# Patient Record
Sex: Female | Born: 1950
Health system: Southern US, Community
[De-identification: ages and names within clinical notes are randomized; demographics above are authoritative.]

---

## 2005-05-30 ENCOUNTER — Ambulatory Visit: Payer: Self-pay | Admitting: Urology

## 2005-10-22 ENCOUNTER — Emergency Department: Payer: Self-pay | Admitting: Emergency Medicine

## 2005-11-26 ENCOUNTER — Ambulatory Visit: Payer: Self-pay | Admitting: Internal Medicine

## 2006-10-05 DIAGNOSIS — D229 Melanocytic nevi, unspecified: Secondary | ICD-10-CM

## 2006-10-05 HISTORY — DX: Melanocytic nevi, unspecified: D22.9

## 2006-11-30 ENCOUNTER — Ambulatory Visit: Payer: Self-pay | Admitting: Internal Medicine

## 2007-12-06 ENCOUNTER — Ambulatory Visit: Payer: Self-pay | Admitting: Internal Medicine

## 2008-12-13 ENCOUNTER — Ambulatory Visit: Payer: Self-pay | Admitting: Internal Medicine

## 2009-06-26 ENCOUNTER — Ambulatory Visit: Payer: Self-pay | Admitting: Urology

## 2009-11-02 ENCOUNTER — Ambulatory Visit: Payer: Self-pay | Admitting: Gastroenterology

## 2010-01-09 ENCOUNTER — Ambulatory Visit: Payer: Self-pay | Admitting: Podiatry

## 2010-01-23 ENCOUNTER — Ambulatory Visit: Payer: Self-pay | Admitting: Internal Medicine

## 2010-09-01 HISTORY — PX: AUGMENTATION MAMMAPLASTY: SUR837

## 2011-01-28 ENCOUNTER — Ambulatory Visit: Payer: Self-pay | Admitting: Internal Medicine

## 2012-01-29 ENCOUNTER — Ambulatory Visit: Payer: Self-pay | Admitting: Internal Medicine

## 2013-04-25 ENCOUNTER — Ambulatory Visit: Payer: Self-pay | Admitting: Internal Medicine

## 2013-04-29 ENCOUNTER — Ambulatory Visit: Payer: Self-pay | Admitting: Internal Medicine

## 2013-11-01 ENCOUNTER — Ambulatory Visit: Payer: Self-pay | Admitting: Internal Medicine

## 2014-07-21 ENCOUNTER — Ambulatory Visit: Payer: Self-pay | Admitting: Internal Medicine

## 2014-07-26 ENCOUNTER — Ambulatory Visit: Payer: Self-pay | Admitting: Internal Medicine

## 2014-10-27 ENCOUNTER — Ambulatory Visit: Payer: Self-pay | Admitting: Internal Medicine

## 2014-12-15 ENCOUNTER — Other Ambulatory Visit: Payer: Self-pay | Admitting: Internal Medicine

## 2014-12-15 DIAGNOSIS — N6320 Unspecified lump in the left breast, unspecified quadrant: Secondary | ICD-10-CM

## 2015-01-26 ENCOUNTER — Inpatient Hospital Stay: Admission: RE | Admit: 2015-01-26 | Payer: Self-pay | Source: Ambulatory Visit

## 2015-02-02 ENCOUNTER — Ambulatory Visit
Admission: RE | Admit: 2015-02-02 | Discharge: 2015-02-02 | Disposition: A | Discharge status: Home / Self Care | Payer: BLUE CROSS/BLUE SHIELD | Source: Ambulatory Visit | Attending: Internal Medicine | Admitting: Internal Medicine

## 2015-02-02 ENCOUNTER — Other Ambulatory Visit: Payer: Self-pay | Admitting: Internal Medicine

## 2015-02-02 DIAGNOSIS — N6320 Unspecified lump in the left breast, unspecified quadrant: Secondary | ICD-10-CM

## 2015-02-02 DIAGNOSIS — N63 Unspecified lump in breast: Secondary | ICD-10-CM | POA: Insufficient documentation

## 2015-07-24 ENCOUNTER — Other Ambulatory Visit: Payer: Self-pay | Admitting: Internal Medicine

## 2015-07-24 DIAGNOSIS — N63 Unspecified lump in unspecified breast: Secondary | ICD-10-CM

## 2015-08-13 ENCOUNTER — Ambulatory Visit
Admission: RE | Admit: 2015-08-13 | Discharge: 2015-08-13 | Disposition: A | Discharge status: Home / Self Care | Payer: BLUE CROSS/BLUE SHIELD | Source: Ambulatory Visit | Attending: Internal Medicine | Admitting: Internal Medicine

## 2015-08-13 DIAGNOSIS — N63 Unspecified lump in unspecified breast: Secondary | ICD-10-CM

## 2015-08-13 DIAGNOSIS — Z9882 Breast implant status: Secondary | ICD-10-CM | POA: Diagnosis not present

## 2015-08-14 ENCOUNTER — Other Ambulatory Visit: Payer: Self-pay | Admitting: Internal Medicine

## 2015-08-14 DIAGNOSIS — N6320 Unspecified lump in the left breast, unspecified quadrant: Secondary | ICD-10-CM

## 2015-08-14 DIAGNOSIS — Z1239 Encounter for other screening for malignant neoplasm of breast: Secondary | ICD-10-CM

## 2015-12-11 ENCOUNTER — Other Ambulatory Visit: Payer: Self-pay | Admitting: Internal Medicine

## 2015-12-11 DIAGNOSIS — Z1239 Encounter for other screening for malignant neoplasm of breast: Secondary | ICD-10-CM

## 2015-12-11 DIAGNOSIS — N63 Unspecified lump in unspecified breast: Secondary | ICD-10-CM

## 2015-12-12 ENCOUNTER — Other Ambulatory Visit: Payer: Self-pay | Admitting: Internal Medicine

## 2015-12-12 DIAGNOSIS — Z1239 Encounter for other screening for malignant neoplasm of breast: Secondary | ICD-10-CM

## 2015-12-12 DIAGNOSIS — N63 Unspecified lump in unspecified breast: Secondary | ICD-10-CM

## 2016-02-19 ENCOUNTER — Other Ambulatory Visit: Payer: Self-pay | Admitting: Internal Medicine

## 2016-02-19 DIAGNOSIS — N63 Unspecified lump in unspecified breast: Secondary | ICD-10-CM

## 2016-02-27 ENCOUNTER — Ambulatory Visit: Payer: BLUE CROSS/BLUE SHIELD | Attending: Internal Medicine

## 2016-02-27 ENCOUNTER — Other Ambulatory Visit: Payer: BLUE CROSS/BLUE SHIELD

## 2016-03-24 ENCOUNTER — Other Ambulatory Visit: Payer: Self-pay | Admitting: Internal Medicine

## 2016-03-24 ENCOUNTER — Ambulatory Visit
Admission: RE | Admit: 2016-03-24 | Discharge: 2016-03-24 | Disposition: A | Discharge status: Home / Self Care | Payer: BLUE CROSS/BLUE SHIELD | Source: Ambulatory Visit | Attending: Internal Medicine | Admitting: Internal Medicine

## 2016-03-24 DIAGNOSIS — N63 Unspecified lump in unspecified breast: Secondary | ICD-10-CM

## 2016-11-26 ENCOUNTER — Other Ambulatory Visit: Payer: Self-pay | Admitting: Internal Medicine

## 2016-11-26 DIAGNOSIS — Z1231 Encounter for screening mammogram for malignant neoplasm of breast: Secondary | ICD-10-CM

## 2016-12-23 ENCOUNTER — Ambulatory Visit
Admission: RE | Admit: 2016-12-23 | Discharge: 2016-12-23 | Disposition: A | Discharge status: Home / Self Care | Payer: BLUE CROSS/BLUE SHIELD | Source: Ambulatory Visit | Attending: Internal Medicine | Admitting: Internal Medicine

## 2016-12-23 DIAGNOSIS — R928 Other abnormal and inconclusive findings on diagnostic imaging of breast: Secondary | ICD-10-CM | POA: Insufficient documentation

## 2016-12-23 DIAGNOSIS — Z1231 Encounter for screening mammogram for malignant neoplasm of breast: Secondary | ICD-10-CM | POA: Diagnosis present

## 2016-12-25 ENCOUNTER — Other Ambulatory Visit: Payer: Self-pay | Admitting: Internal Medicine

## 2016-12-25 DIAGNOSIS — R928 Other abnormal and inconclusive findings on diagnostic imaging of breast: Secondary | ICD-10-CM

## 2016-12-25 DIAGNOSIS — N6489 Other specified disorders of breast: Secondary | ICD-10-CM

## 2017-01-01 ENCOUNTER — Ambulatory Visit
Admission: RE | Admit: 2017-01-01 | Discharge: 2017-01-01 | Disposition: A | Discharge status: Home / Self Care | Payer: BLUE CROSS/BLUE SHIELD | Source: Ambulatory Visit | Attending: Internal Medicine | Admitting: Internal Medicine

## 2017-01-01 DIAGNOSIS — N6489 Other specified disorders of breast: Secondary | ICD-10-CM | POA: Diagnosis present

## 2017-01-01 DIAGNOSIS — R928 Other abnormal and inconclusive findings on diagnostic imaging of breast: Secondary | ICD-10-CM

## 2017-01-02 ENCOUNTER — Other Ambulatory Visit: Payer: Self-pay | Admitting: Internal Medicine

## 2017-01-02 DIAGNOSIS — R928 Other abnormal and inconclusive findings on diagnostic imaging of breast: Secondary | ICD-10-CM

## 2017-01-02 DIAGNOSIS — N6320 Unspecified lump in the left breast, unspecified quadrant: Secondary | ICD-10-CM

## 2017-01-15 ENCOUNTER — Ambulatory Visit
Admission: RE | Admit: 2017-01-15 | Discharge: 2017-01-15 | Disposition: A | Discharge status: Home / Self Care | Payer: BLUE CROSS/BLUE SHIELD | Source: Ambulatory Visit | Attending: Internal Medicine | Admitting: Internal Medicine

## 2017-01-15 DIAGNOSIS — N6489 Other specified disorders of breast: Secondary | ICD-10-CM | POA: Insufficient documentation

## 2017-01-15 DIAGNOSIS — N6032 Fibrosclerosis of left breast: Secondary | ICD-10-CM | POA: Insufficient documentation

## 2017-01-15 DIAGNOSIS — R928 Other abnormal and inconclusive findings on diagnostic imaging of breast: Secondary | ICD-10-CM

## 2017-01-15 DIAGNOSIS — N6320 Unspecified lump in the left breast, unspecified quadrant: Secondary | ICD-10-CM

## 2017-01-15 HISTORY — PX: BREAST BIOPSY: SHX20

## 2017-01-16 LAB — SURGICAL PATHOLOGY

## 2017-05-05 DIAGNOSIS — C4491 Basal cell carcinoma of skin, unspecified: Secondary | ICD-10-CM

## 2017-05-05 HISTORY — DX: Basal cell carcinoma of skin, unspecified: C44.91

## 2017-11-12 DIAGNOSIS — H2513 Age-related nuclear cataract, bilateral: Secondary | ICD-10-CM | POA: Diagnosis not present

## 2017-11-12 DIAGNOSIS — R350 Frequency of micturition: Secondary | ICD-10-CM | POA: Diagnosis not present

## 2017-11-12 DIAGNOSIS — R8290 Unspecified abnormal findings in urine: Secondary | ICD-10-CM | POA: Diagnosis not present

## 2018-03-01 DIAGNOSIS — R300 Dysuria: Secondary | ICD-10-CM | POA: Diagnosis not present

## 2018-04-19 DIAGNOSIS — E559 Vitamin D deficiency, unspecified: Secondary | ICD-10-CM | POA: Diagnosis not present

## 2018-04-19 DIAGNOSIS — I10 Essential (primary) hypertension: Secondary | ICD-10-CM | POA: Diagnosis not present

## 2018-04-19 DIAGNOSIS — E7800 Pure hypercholesterolemia, unspecified: Secondary | ICD-10-CM | POA: Diagnosis not present

## 2018-04-19 DIAGNOSIS — Z0000 Encounter for general adult medical examination without abnormal findings: Secondary | ICD-10-CM | POA: Diagnosis not present

## 2018-04-19 DIAGNOSIS — Z79899 Other long term (current) drug therapy: Secondary | ICD-10-CM | POA: Diagnosis not present

## 2018-04-19 DIAGNOSIS — M8580 Other specified disorders of bone density and structure, unspecified site: Secondary | ICD-10-CM | POA: Diagnosis not present

## 2018-04-19 DIAGNOSIS — Z1239 Encounter for other screening for malignant neoplasm of breast: Secondary | ICD-10-CM | POA: Diagnosis not present

## 2018-04-19 DIAGNOSIS — M810 Age-related osteoporosis without current pathological fracture: Secondary | ICD-10-CM | POA: Diagnosis not present

## 2018-05-04 DIAGNOSIS — E559 Vitamin D deficiency, unspecified: Secondary | ICD-10-CM | POA: Diagnosis not present

## 2018-05-04 DIAGNOSIS — E7800 Pure hypercholesterolemia, unspecified: Secondary | ICD-10-CM | POA: Diagnosis not present

## 2018-05-04 DIAGNOSIS — Z79899 Other long term (current) drug therapy: Secondary | ICD-10-CM | POA: Diagnosis not present

## 2018-05-04 DIAGNOSIS — I10 Essential (primary) hypertension: Secondary | ICD-10-CM | POA: Diagnosis not present

## 2018-05-04 DIAGNOSIS — N390 Urinary tract infection, site not specified: Secondary | ICD-10-CM | POA: Diagnosis not present

## 2018-09-26 IMAGING — MG MM DIGITAL DIAGNOSTIC UNILAT*L* IMPLANT W/ TOMO W/ CAD
6 of 9 series · 6 of 21 positions shown · non-contrast
Comparison: Previous exam(s).

CLINICAL DATA: Screening recall for a left breast asymmetry

EXAM:
2D DIGITAL DIAGNOSTIC LEFT MAMMOGRAM WITH IMPLANTS, CAD AND ADJUNCT
TOMO
The patient has retropectoral implants. Standard and implant
displaced views were performed.
LEFT BREAST ULTRASOUND

[L MLO synth-2D]
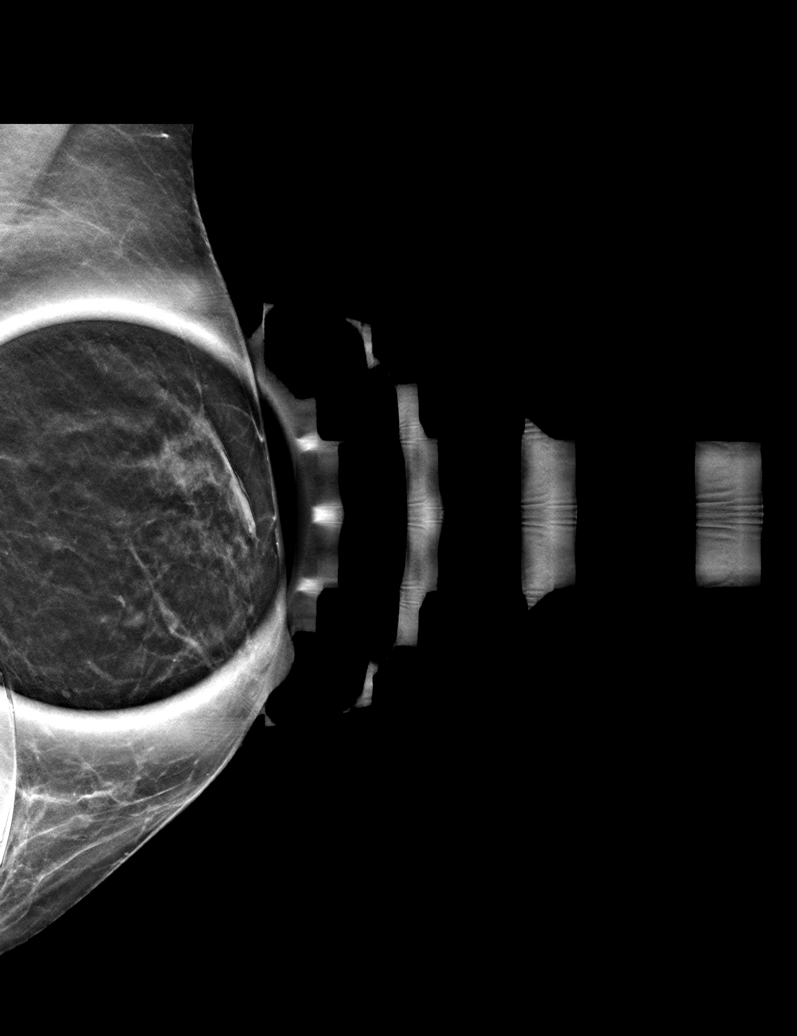

[L CC]
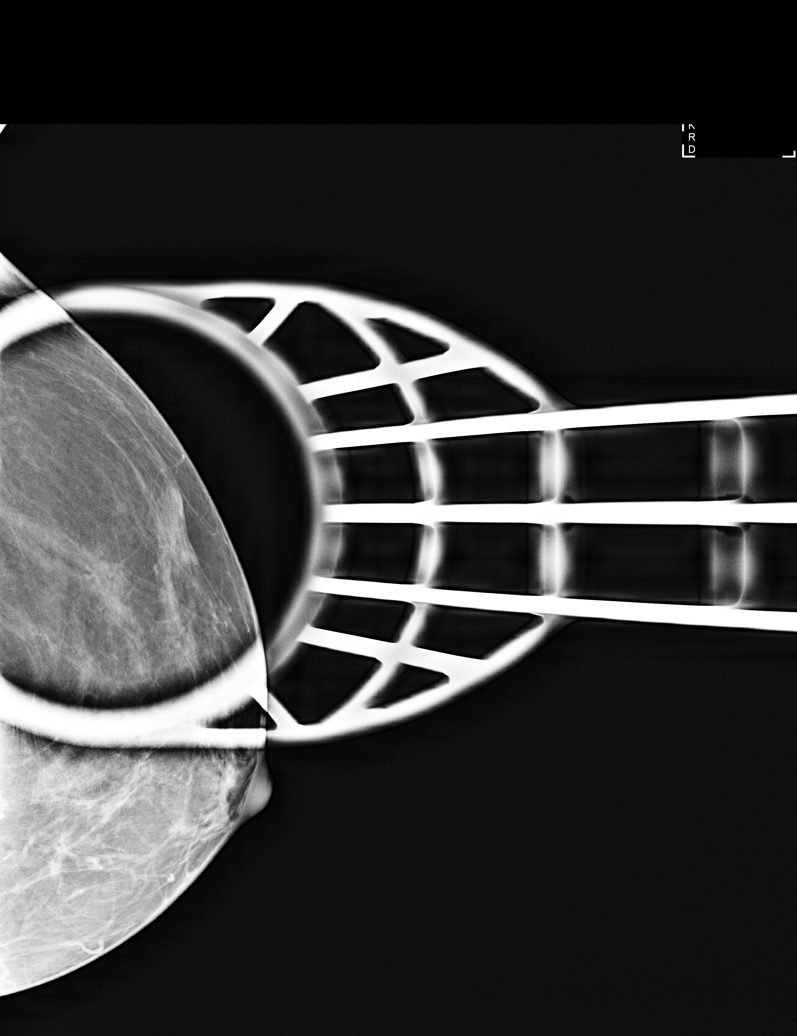

[L ML]
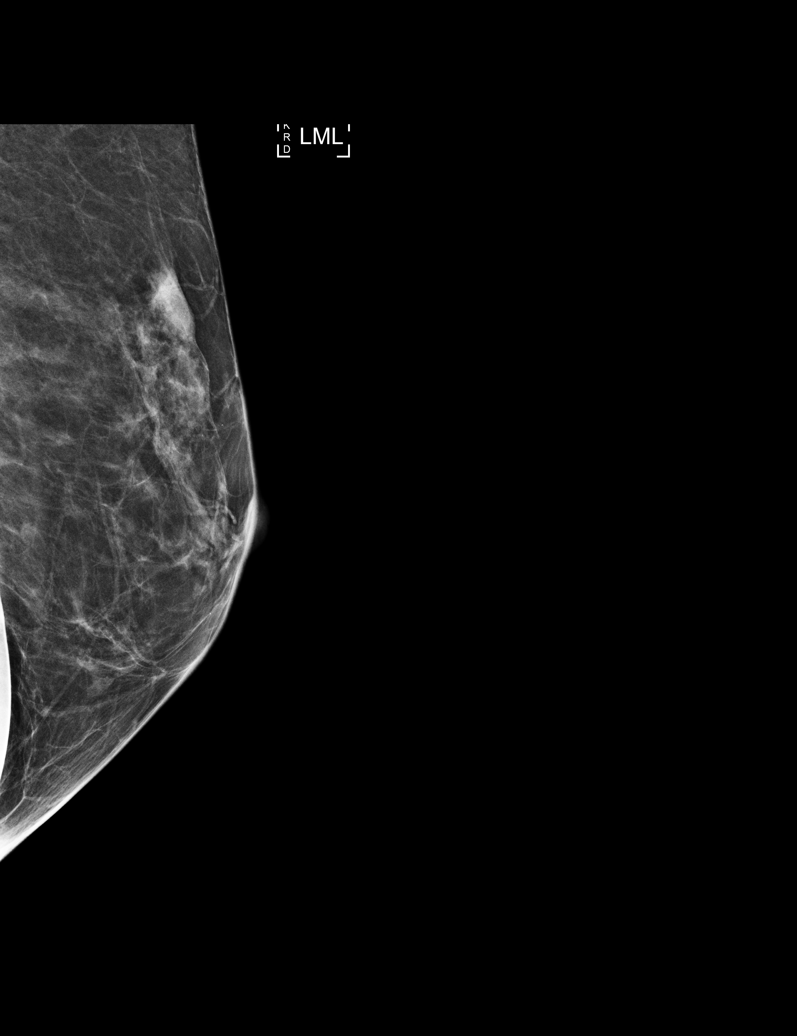

[L CC synth-2D]
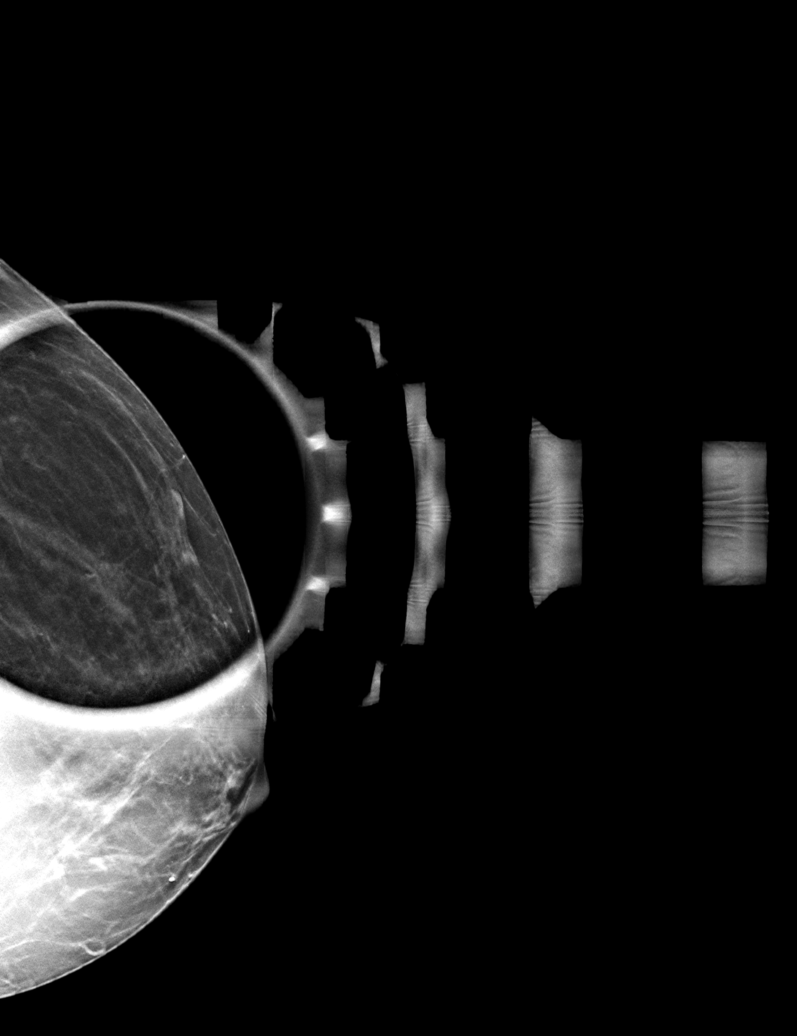

[L MLO]
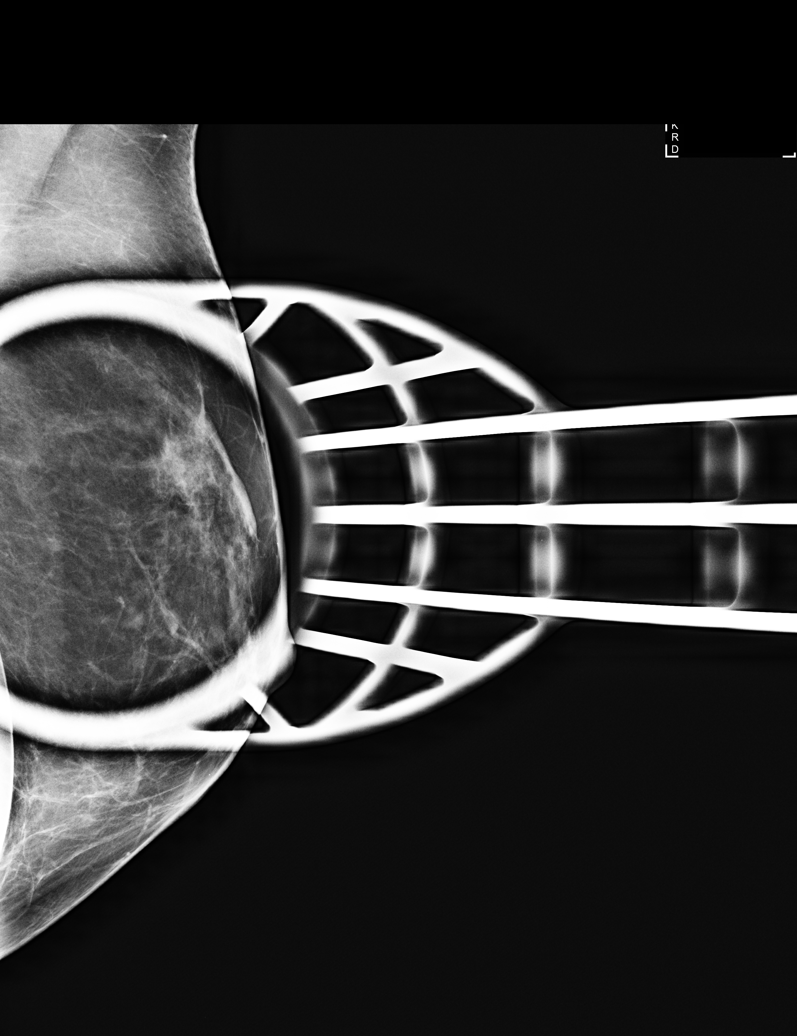

[L ML synth-2D]
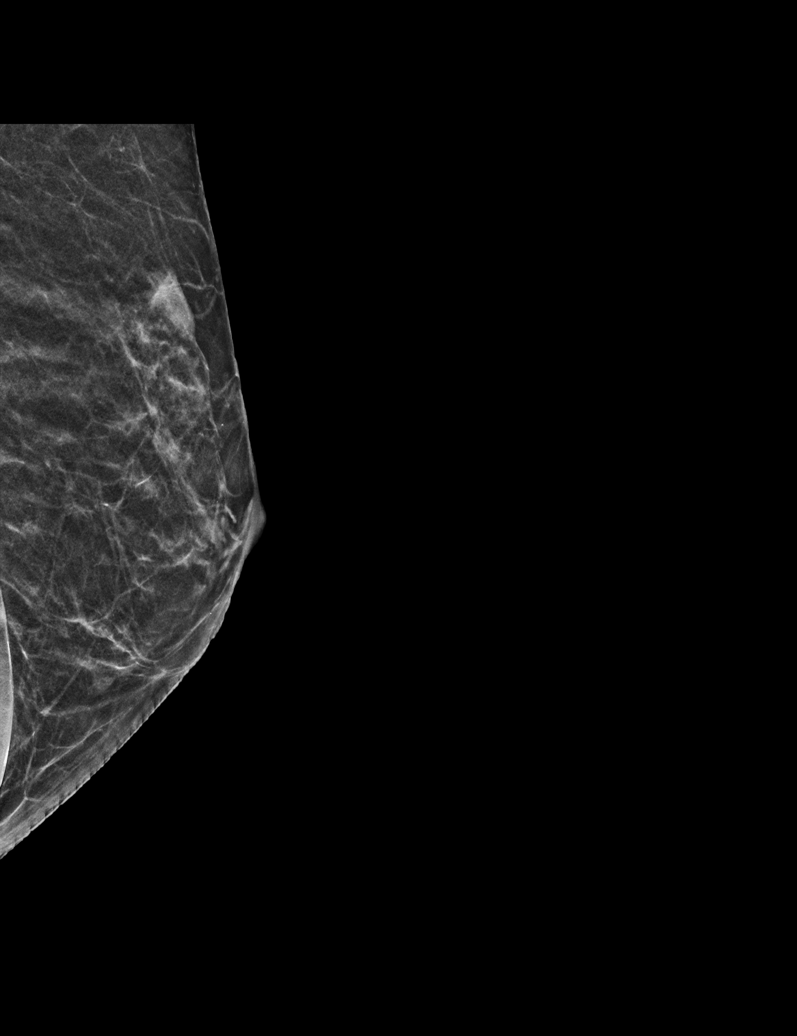

[6 of 21 positions shown; findings below may reference images not displayed]

ACR Breast Density Category c: The breast tissue is heterogeneously
dense, which may obscure small masses.
FINDINGS: The asymmetry in the upper-outer quadrant of the left breast appears
less prominent on the spot compression tomosynthesis images, similar
to prior mammograms. In particular, the appearance of the tissue has
not significantly changed since the 01/25/2003 exam. Due to the
breast tissue density however, ultrasound will be performed for
further evaluation.

Mammographic images were processed with CAD.

Physical exam of the superior and upper outer left breast
demonstrates no discrete palpable masses.

Ultrasound targeted to the left breast 12 o'clock, 4 cm from the
nipple demonstrates an irregular hypoechoic mass measuring 9 x 3 x 7
mm. No blood flow was identified within the mass on color Doppler
imaging.
IMPRESSION: 1.  There is an indeterminate mass in the left breast at 12 o'clock.

RECOMMENDATION:
1. Ultrasound-guided biopsy is recommended for the left breast mass
at 12 o'clock.

2. Prior to biopsy, ultrasound of the left axilla is recommended, as
it was inadvertently not performed during the diagnostic workup.

I have discussed the findings and recommendations with the patient.
Results were also provided in writing at the conclusion of the
visit. If applicable, a reminder letter will be sent to the patient
regarding the next appointment.

BI-RADS CATEGORY  4: Suspicious.

## 2018-10-05 ENCOUNTER — Other Ambulatory Visit: Payer: Self-pay | Admitting: Internal Medicine

## 2018-10-05 DIAGNOSIS — Z1231 Encounter for screening mammogram for malignant neoplasm of breast: Secondary | ICD-10-CM

## 2018-10-15 ENCOUNTER — Ambulatory Visit
Admission: RE | Admit: 2018-10-15 | Discharge: 2018-10-15 | Disposition: A | Discharge status: Home / Self Care | Payer: PPO | Source: Ambulatory Visit | Attending: Internal Medicine | Admitting: Internal Medicine

## 2018-10-15 DIAGNOSIS — Z1231 Encounter for screening mammogram for malignant neoplasm of breast: Secondary | ICD-10-CM | POA: Insufficient documentation

## 2018-10-19 DIAGNOSIS — M8580 Other specified disorders of bone density and structure, unspecified site: Secondary | ICD-10-CM | POA: Diagnosis not present

## 2018-10-19 DIAGNOSIS — E559 Vitamin D deficiency, unspecified: Secondary | ICD-10-CM | POA: Diagnosis not present

## 2018-10-19 DIAGNOSIS — R002 Palpitations: Secondary | ICD-10-CM | POA: Diagnosis not present

## 2018-10-19 DIAGNOSIS — N951 Menopausal and female climacteric states: Secondary | ICD-10-CM | POA: Diagnosis not present

## 2018-10-19 DIAGNOSIS — E7800 Pure hypercholesterolemia, unspecified: Secondary | ICD-10-CM | POA: Diagnosis not present

## 2018-10-19 DIAGNOSIS — I10 Essential (primary) hypertension: Secondary | ICD-10-CM | POA: Diagnosis not present

## 2018-10-19 DIAGNOSIS — Z79899 Other long term (current) drug therapy: Secondary | ICD-10-CM | POA: Diagnosis not present

## 2018-10-19 DIAGNOSIS — Z0000 Encounter for general adult medical examination without abnormal findings: Secondary | ICD-10-CM | POA: Diagnosis not present

## 2019-02-15 DIAGNOSIS — H903 Sensorineural hearing loss, bilateral: Secondary | ICD-10-CM | POA: Diagnosis not present

## 2019-02-16 DIAGNOSIS — H2513 Age-related nuclear cataract, bilateral: Secondary | ICD-10-CM | POA: Diagnosis not present

## 2019-05-25 DIAGNOSIS — E7800 Pure hypercholesterolemia, unspecified: Secondary | ICD-10-CM | POA: Diagnosis not present

## 2019-05-25 DIAGNOSIS — M79671 Pain in right foot: Secondary | ICD-10-CM | POA: Diagnosis not present

## 2019-05-25 DIAGNOSIS — E559 Vitamin D deficiency, unspecified: Secondary | ICD-10-CM | POA: Diagnosis not present

## 2019-05-25 DIAGNOSIS — Z79899 Other long term (current) drug therapy: Secondary | ICD-10-CM | POA: Diagnosis not present

## 2019-05-25 DIAGNOSIS — Z23 Encounter for immunization: Secondary | ICD-10-CM | POA: Diagnosis not present

## 2019-05-25 DIAGNOSIS — M79672 Pain in left foot: Secondary | ICD-10-CM | POA: Diagnosis not present

## 2019-05-25 DIAGNOSIS — I10 Essential (primary) hypertension: Secondary | ICD-10-CM | POA: Diagnosis not present

## 2019-06-06 DIAGNOSIS — R8290 Unspecified abnormal findings in urine: Secondary | ICD-10-CM | POA: Diagnosis not present

## 2019-06-06 DIAGNOSIS — R350 Frequency of micturition: Secondary | ICD-10-CM | POA: Diagnosis not present

## 2019-09-12 DIAGNOSIS — M79602 Pain in left arm: Secondary | ICD-10-CM | POA: Diagnosis not present

## 2019-09-12 DIAGNOSIS — M67922 Unspecified disorder of synovium and tendon, left upper arm: Secondary | ICD-10-CM | POA: Diagnosis not present

## 2019-09-12 DIAGNOSIS — M25422 Effusion, left elbow: Secondary | ICD-10-CM | POA: Diagnosis not present

## 2019-09-12 DIAGNOSIS — M25522 Pain in left elbow: Secondary | ICD-10-CM | POA: Diagnosis not present

## 2019-11-14 ENCOUNTER — Other Ambulatory Visit: Payer: Self-pay | Admitting: Internal Medicine

## 2019-11-14 DIAGNOSIS — Z1231 Encounter for screening mammogram for malignant neoplasm of breast: Secondary | ICD-10-CM

## 2019-11-17 DIAGNOSIS — R8290 Unspecified abnormal findings in urine: Secondary | ICD-10-CM | POA: Diagnosis not present

## 2019-11-17 DIAGNOSIS — E7800 Pure hypercholesterolemia, unspecified: Secondary | ICD-10-CM | POA: Diagnosis not present

## 2019-11-17 DIAGNOSIS — I10 Essential (primary) hypertension: Secondary | ICD-10-CM | POA: Diagnosis not present

## 2019-11-17 DIAGNOSIS — E559 Vitamin D deficiency, unspecified: Secondary | ICD-10-CM | POA: Diagnosis not present

## 2019-11-17 DIAGNOSIS — Z79899 Other long term (current) drug therapy: Secondary | ICD-10-CM | POA: Diagnosis not present

## 2019-11-24 DIAGNOSIS — Z1211 Encounter for screening for malignant neoplasm of colon: Secondary | ICD-10-CM | POA: Diagnosis not present

## 2019-11-24 DIAGNOSIS — Z0000 Encounter for general adult medical examination without abnormal findings: Secondary | ICD-10-CM | POA: Diagnosis not present

## 2019-11-24 DIAGNOSIS — E7800 Pure hypercholesterolemia, unspecified: Secondary | ICD-10-CM | POA: Diagnosis not present

## 2019-11-24 DIAGNOSIS — Z13820 Encounter for screening for osteoporosis: Secondary | ICD-10-CM | POA: Diagnosis not present

## 2019-11-24 DIAGNOSIS — E559 Vitamin D deficiency, unspecified: Secondary | ICD-10-CM | POA: Diagnosis not present

## 2019-11-24 DIAGNOSIS — M8580 Other specified disorders of bone density and structure, unspecified site: Secondary | ICD-10-CM | POA: Diagnosis not present

## 2019-11-24 DIAGNOSIS — N390 Urinary tract infection, site not specified: Secondary | ICD-10-CM | POA: Diagnosis not present

## 2019-11-24 DIAGNOSIS — I10 Essential (primary) hypertension: Secondary | ICD-10-CM | POA: Diagnosis not present

## 2019-11-25 ENCOUNTER — Other Ambulatory Visit: Payer: Self-pay

## 2019-11-25 ENCOUNTER — Other Ambulatory Visit: Payer: Self-pay | Admitting: Podiatry

## 2019-11-25 ENCOUNTER — Ambulatory Visit (INDEPENDENT_AMBULATORY_CARE_PROVIDER_SITE_OTHER): Payer: PPO

## 2019-11-25 ENCOUNTER — Encounter: Payer: Self-pay | Admitting: Podiatry

## 2019-11-25 ENCOUNTER — Ambulatory Visit: Payer: PPO | Admitting: Podiatry

## 2019-11-25 DIAGNOSIS — M722 Plantar fascial fibromatosis: Secondary | ICD-10-CM

## 2019-11-25 MED ORDER — MELOXICAM 15 MG PO TABS: 15.0000 mg | ORAL_TABLET | Freq: Every day | ORAL | 1 refills | Status: DC

## 2019-11-29 NOTE — Progress Notes (Signed)
   Subjective: 69 y.o. female presenting today as a new patient with a chief complaint of intermittent burning, aching pain to the left heel that began 3-4 months ago. Walking increases the pain. She has been using a plantar fascial brace, wearing Brooks shoes and taking Tylenol Arthritis for treatment. Patient is here for further evaluation and treatment.  No past medical history on file.   Objective: Physical Exam General: The patient is alert and oriented x3 in no acute distress.  Dermatology: Skin is warm, dry and supple bilateral lower extremities. Negative for open lesions or macerations bilateral.   Vascular: Dorsalis Pedis and Posterior Tibial pulses palpable bilateral.  Capillary fill time is immediate to all digits.  Neurological: Epicritic and protective threshold intact bilateral.   Musculoskeletal: Tenderness to palpation to the plantar aspect of the left heel along the plantar fascia. All other joints range of motion within normal limits bilateral. Strength 5/5 in all groups bilateral.   Radiographic exam: Normal osseous mineralization. Joint spaces preserved. No fracture/dislocation/boney destruction. No other soft tissue abnormalities or radiopaque foreign bodies.   Assessment: 1. Plantar fasciitis left foot  Plan of Care:  1. Patient evaluated. Xrays reviewed.   2. Injection of 0.5cc Celestone soluspan injected into the left plantar fascia.  3. Rx for Meloxicam ordered for patient. 4. Plantar fascial band(s) dispensed. 5. Continue wearing Brooks running shoes.  6. Instructed patient regarding therapies and modalities at home to alleviate symptoms.  7. Return to clinic in 4 weeks.     Edrick Kins, DPM [facility]  Dr. Edrick Kins, DPM    [address], [place] [address]                Office [phone]  Fax [phone]

## 2019-12-01 ENCOUNTER — Ambulatory Visit
Admission: RE | Admit: 2019-12-01 | Discharge: 2019-12-01 | Disposition: A | Discharge status: Home / Self Care | Payer: PPO | Source: Ambulatory Visit | Attending: Internal Medicine | Admitting: Internal Medicine

## 2019-12-01 DIAGNOSIS — Z1231 Encounter for screening mammogram for malignant neoplasm of breast: Secondary | ICD-10-CM | POA: Diagnosis not present

## 2019-12-01 DIAGNOSIS — M810 Age-related osteoporosis without current pathological fracture: Secondary | ICD-10-CM | POA: Diagnosis not present

## 2019-12-25 ENCOUNTER — Other Ambulatory Visit: Payer: Self-pay | Admitting: Podiatry

## 2019-12-26 ENCOUNTER — Ambulatory Visit: Payer: PPO | Admitting: Dermatology

## 2019-12-26 ENCOUNTER — Other Ambulatory Visit: Payer: Self-pay

## 2019-12-26 ENCOUNTER — Encounter: Payer: Self-pay | Admitting: Dermatology

## 2019-12-26 DIAGNOSIS — D1800 Hemangioma unspecified site: Secondary | ICD-10-CM

## 2019-12-26 DIAGNOSIS — Z85828 Personal history of other malignant neoplasm of skin: Secondary | ICD-10-CM

## 2019-12-26 DIAGNOSIS — Z1283 Encounter for screening for malignant neoplasm of skin: Secondary | ICD-10-CM

## 2019-12-26 DIAGNOSIS — D229 Melanocytic nevi, unspecified: Secondary | ICD-10-CM

## 2019-12-26 DIAGNOSIS — L821 Other seborrheic keratosis: Secondary | ICD-10-CM

## 2019-12-26 DIAGNOSIS — L814 Other melanin hyperpigmentation: Secondary | ICD-10-CM

## 2019-12-26 DIAGNOSIS — Z86018 Personal history of other benign neoplasm: Secondary | ICD-10-CM | POA: Diagnosis not present

## 2019-12-26 DIAGNOSIS — L578 Other skin changes due to chronic exposure to nonionizing radiation: Secondary | ICD-10-CM | POA: Diagnosis not present

## 2019-12-26 DIAGNOSIS — D2271 Melanocytic nevi of right lower limb, including hip: Secondary | ICD-10-CM | POA: Diagnosis not present

## 2019-12-26 DIAGNOSIS — L820 Inflamed seborrheic keratosis: Secondary | ICD-10-CM | POA: Diagnosis not present

## 2019-12-26 NOTE — Progress Notes (Signed)
Follow-Up Visit   Subjective  Sonya Schmidt is a 69 y.o. female who presents for the following: Annual Exam.  Patient here today for annual TBSE. She does have a history of BCC and dysplastic nevi. Patient advises she does have some spots that may be new and one that is dark on her upper abdomen. She has growths that get irritated by her bra and by shaving.   The following portions of the chart were reviewed this encounter and updated as appropriate:     Review of Systems:  No other skin or systemic complaints except as noted in HPI or Assessment and Plan.  Objective  Well appearing patient in no apparent distress; mood and affect are within normal limits.  A full examination was performed including scalp, head, eyes, ears, nose, lips, neck, chest, axillae, abdomen, back, buttocks, bilateral upper extremities, bilateral lower extremities, hands, feet, fingers, toes, fingernails, and toenails. All findings within normal limits unless otherwise noted below.  Objective  Left Lower Back medial to scar: Well healed scar with no evidence of recurrence.   Objective  Left Hip - Posterior: Scar with no evidence of recurrence.   Objective  Left Flank at braline x 6, right inframammary x 1, left lateral calf x 1, right axilla x 1, right flank x 1 (10): Erythematous keratotic or waxy stuck-on papule or plaque.   Objective  Right Upper Thigh: 64mm medium dark brown macule  Objective  Right Upper Breast: 9 x 52mm waxy brown papule, darker superior   Assessment & Plan  History of basal cell carcinoma (BCC) Left Lower Back medial to scar  No evidence of recurrence, call clinic for new or changing lesions.   History of dysplastic nevus Left Hip - Posterior  No evidence of recurrence, call clinic for new or changing lesions.   Inflamed seborrheic keratosis (10) Left Flank at braline x 6, right inframammary x 1, left lateral calf x 1, right axilla x 1, right flank x  1  Destruction of lesion - Left Flank at braline x 6, right inframammary x 1, left lateral calf x 1, right axilla x 1, right flank x 1 Complexity: simple   Destruction method: cryotherapy   Informed consent: discussed and consent obtained   Lesion destroyed using liquid nitrogen: Yes   Region frozen until ice ball extended beyond lesion: Yes   Outcome: patient tolerated procedure well with no complications   Post-procedure details: wound care instructions given    Nevus Right Upper Thigh  Benign-appearing.  Observation.  Call clinic for new or changing moles.  Recommend daily use of broad spectrum spf 30+ sunscreen to sun-exposed areas.     Seborrheic keratosis Right Upper Breast  Benign, observe.     Lentigines - Scattered tan macules - Discussed due to sun exposure - Benign, observe - Call for any changes  Seborrheic Keratoses - Stuck-on, waxy, tan-brown papules and plaques  - Discussed benign etiology and prognosis. - Observe - Call for any changes  Melanocytic Nevi - Tan-brown and/or pink-flesh-colored symmetric macules and papules - Benign appearing on exam today - Observation - Call clinic for new or changing moles - Recommend daily use of broad spectrum spf 30+ sunscreen to sun-exposed areas.   Hemangiomas - Red papules - Discussed benign nature - Observe - Call for any changes  Actinic Damage - diffuse scaly erythematous macules with underlying dyspigmentation - Recommend daily broad spectrum sunscreen SPF 30+ to sun-exposed areas, reapply every 2 hours as needed.  -  Call for new or changing lesions.  Skin cancer screening performed today.  Return in about 1 year (around 12/25/2020) for TBSE.  Graciella Belton, RMA, am acting as scribe for Brendolyn Patty, MD .  Documentation: I have reviewed the above documentation for accuracy and completeness, and I agree with the above.  Brendolyn Patty, MD

## 2019-12-30 ENCOUNTER — Ambulatory Visit: Payer: PPO | Admitting: Podiatry

## 2020-01-06 DIAGNOSIS — M810 Age-related osteoporosis without current pathological fracture: Secondary | ICD-10-CM | POA: Diagnosis not present

## 2020-01-13 ENCOUNTER — Ambulatory Visit: Payer: PPO | Admitting: Podiatry

## 2020-02-03 ENCOUNTER — Ambulatory Visit: Payer: PPO | Admitting: Podiatry

## 2020-03-06 DIAGNOSIS — R399 Unspecified symptoms and signs involving the genitourinary system: Secondary | ICD-10-CM | POA: Diagnosis not present

## 2020-03-13 DIAGNOSIS — E559 Vitamin D deficiency, unspecified: Secondary | ICD-10-CM | POA: Diagnosis not present

## 2020-03-13 DIAGNOSIS — M810 Age-related osteoporosis without current pathological fracture: Secondary | ICD-10-CM | POA: Insufficient documentation

## 2020-03-13 DIAGNOSIS — E7800 Pure hypercholesterolemia, unspecified: Secondary | ICD-10-CM | POA: Diagnosis not present

## 2020-03-13 DIAGNOSIS — I10 Essential (primary) hypertension: Secondary | ICD-10-CM | POA: Diagnosis not present

## 2020-03-19 DIAGNOSIS — H6123 Impacted cerumen, bilateral: Secondary | ICD-10-CM | POA: Diagnosis not present

## 2020-03-19 DIAGNOSIS — H903 Sensorineural hearing loss, bilateral: Secondary | ICD-10-CM | POA: Diagnosis not present

## 2020-08-10 DIAGNOSIS — Z86010 Personal history of colonic polyps: Secondary | ICD-10-CM | POA: Diagnosis not present

## 2020-09-13 DIAGNOSIS — E559 Vitamin D deficiency, unspecified: Secondary | ICD-10-CM | POA: Diagnosis not present

## 2020-09-13 DIAGNOSIS — R350 Frequency of micturition: Secondary | ICD-10-CM | POA: Diagnosis not present

## 2020-09-13 DIAGNOSIS — I10 Essential (primary) hypertension: Secondary | ICD-10-CM | POA: Diagnosis not present

## 2020-09-13 DIAGNOSIS — E7800 Pure hypercholesterolemia, unspecified: Secondary | ICD-10-CM | POA: Diagnosis not present

## 2020-09-13 DIAGNOSIS — Z1231 Encounter for screening mammogram for malignant neoplasm of breast: Secondary | ICD-10-CM | POA: Diagnosis not present

## 2020-09-13 DIAGNOSIS — N390 Urinary tract infection, site not specified: Secondary | ICD-10-CM | POA: Diagnosis not present

## 2020-09-13 DIAGNOSIS — R300 Dysuria: Secondary | ICD-10-CM | POA: Diagnosis not present

## 2020-09-13 DIAGNOSIS — M810 Age-related osteoporosis without current pathological fracture: Secondary | ICD-10-CM | POA: Diagnosis not present

## 2020-09-14 ENCOUNTER — Other Ambulatory Visit: Payer: Self-pay | Admitting: Internal Medicine

## 2020-09-14 DIAGNOSIS — Z1231 Encounter for screening mammogram for malignant neoplasm of breast: Secondary | ICD-10-CM

## 2020-11-19 DIAGNOSIS — Z01818 Encounter for other preprocedural examination: Secondary | ICD-10-CM | POA: Diagnosis not present

## 2020-11-22 DIAGNOSIS — Z86010 Personal history of colonic polyps: Secondary | ICD-10-CM | POA: Diagnosis not present

## 2020-12-10 ENCOUNTER — Ambulatory Visit
Admission: RE | Admit: 2020-12-10 | Discharge: 2020-12-10 | Disposition: A | Discharge status: Home / Self Care | Payer: PPO | Source: Ambulatory Visit | Attending: Internal Medicine | Admitting: Internal Medicine

## 2020-12-10 ENCOUNTER — Other Ambulatory Visit: Payer: Self-pay

## 2020-12-10 DIAGNOSIS — Z1231 Encounter for screening mammogram for malignant neoplasm of breast: Secondary | ICD-10-CM | POA: Diagnosis not present

## 2020-12-25 ENCOUNTER — Ambulatory Visit: Payer: PPO | Admitting: Dermatology

## 2021-02-05 DIAGNOSIS — M810 Age-related osteoporosis without current pathological fracture: Secondary | ICD-10-CM | POA: Diagnosis not present

## 2021-08-06 ENCOUNTER — Ambulatory Visit: Payer: PPO | Admitting: Dermatology

## 2021-10-29 DIAGNOSIS — Z1231 Encounter for screening mammogram for malignant neoplasm of breast: Secondary | ICD-10-CM | POA: Diagnosis not present

## 2021-10-29 DIAGNOSIS — M810 Age-related osteoporosis without current pathological fracture: Secondary | ICD-10-CM | POA: Diagnosis not present

## 2021-10-29 DIAGNOSIS — E559 Vitamin D deficiency, unspecified: Secondary | ICD-10-CM | POA: Diagnosis not present

## 2021-10-29 DIAGNOSIS — E7800 Pure hypercholesterolemia, unspecified: Secondary | ICD-10-CM | POA: Diagnosis not present

## 2021-10-29 DIAGNOSIS — R8290 Unspecified abnormal findings in urine: Secondary | ICD-10-CM | POA: Diagnosis not present

## 2021-10-29 DIAGNOSIS — Z79899 Other long term (current) drug therapy: Secondary | ICD-10-CM | POA: Diagnosis not present

## 2021-10-29 DIAGNOSIS — I10 Essential (primary) hypertension: Secondary | ICD-10-CM | POA: Diagnosis not present

## 2021-11-11 ENCOUNTER — Other Ambulatory Visit: Payer: Self-pay

## 2021-11-11 ENCOUNTER — Ambulatory Visit: Payer: PPO | Admitting: Dermatology

## 2021-11-11 DIAGNOSIS — D1800 Hemangioma unspecified site: Secondary | ICD-10-CM

## 2021-11-11 DIAGNOSIS — L819 Disorder of pigmentation, unspecified: Secondary | ICD-10-CM

## 2021-11-11 DIAGNOSIS — D229 Melanocytic nevi, unspecified: Secondary | ICD-10-CM | POA: Diagnosis not present

## 2021-11-11 DIAGNOSIS — D2271 Melanocytic nevi of right lower limb, including hip: Secondary | ICD-10-CM | POA: Diagnosis not present

## 2021-11-11 DIAGNOSIS — Z85828 Personal history of other malignant neoplasm of skin: Secondary | ICD-10-CM | POA: Diagnosis not present

## 2021-11-11 DIAGNOSIS — Z86018 Personal history of other benign neoplasm: Secondary | ICD-10-CM | POA: Diagnosis not present

## 2021-11-11 DIAGNOSIS — Z1283 Encounter for screening for malignant neoplasm of skin: Secondary | ICD-10-CM | POA: Diagnosis not present

## 2021-11-11 DIAGNOSIS — L905 Scar conditions and fibrosis of skin: Secondary | ICD-10-CM

## 2021-11-11 DIAGNOSIS — L821 Other seborrheic keratosis: Secondary | ICD-10-CM

## 2021-11-11 DIAGNOSIS — L578 Other skin changes due to chronic exposure to nonionizing radiation: Secondary | ICD-10-CM

## 2021-11-11 DIAGNOSIS — L820 Inflamed seborrheic keratosis: Secondary | ICD-10-CM

## 2021-11-11 DIAGNOSIS — L814 Other melanin hyperpigmentation: Secondary | ICD-10-CM

## 2021-11-11 MED ORDER — HYDROQUINONE 4 % EX CREA: TOPICAL_CREAM | Freq: Two times a day (BID) | CUTANEOUS | 1 refills | Status: DC

## 2021-11-11 NOTE — Progress Notes (Signed)
? ?Follow-Up Visit ?  ?Subjective  ?Sonya Schmidt is a 71 y.o. female who presents for the following: Total body skin exam (Hx of BCC L lower back medial to scar, hx of Dysplastic Nevi L post hip, R lower back). ? ?The patient presents for Total-Body Skin Exam (TBSE) for skin cancer screening and mole check.  The patient has spots, moles and lesions to be evaluated, some may be new or changing and the patient has concerns that these could be cancer. She has a few spots under R arm, L hip, and scalp. ? ?The following portions of the chart were reviewed this encounter and updated as appropriate:  ?  ?  ? ?Review of Systems:  No other skin or systemic complaints except as noted in HPI or Assessment and Plan. ? ?Objective  ?Well appearing patient in no apparent distress; mood and affect are within normal limits. ? ?A full examination was performed including scalp, head, eyes, ears, nose, lips, neck, chest, axillae, abdomen, back, buttocks, bilateral upper extremities, bilateral lower extremities, hands, feet, fingers, toes, fingernails, and toenails. All findings within normal limits unless otherwise noted below. ? ?R upper breast, R paranasal ?1.1 x 0.7cm 2 tone waxy pap R upper breast ?6.54m flesh tan pap R paranasal ? ?Right Axilla x 6, L thigh/hip x 1, Vertex scalp x 1 (8) ?Stuck on waxy paps with erythema ? ?L lower leg ?Smooth hyperpigmented patch ? ?R upper post thigh ?2.097mmedium dark brown macule ? ? ? ?Assessment & Plan  ?Seborrheic keratosis ?R upper breast, R paranasal ? ?Reassured benign age-related growth.  Recommend observation.  Discussed cryotherapy if spot(s) become irritated or inflamed.  ? ?Inflamed seborrheic keratosis (8) ?Right Axilla x 6, L thigh/hip x 1, Vertex scalp x 1 ? ?Recurrent ISK on vertex scalp ? ?Destruction of lesion - Right Axilla x 6, L thigh/hip x 1, Vertex scalp x 1 ? ?Destruction method: cryotherapy   ?Informed consent: discussed and consent obtained   ?Lesion destroyed using  liquid nitrogen: Yes   ?Region frozen until ice ball extended beyond lesion: Yes   ?Outcome: patient tolerated procedure well with no complications   ?Post-procedure details: wound care instructions given   ?Additional details:  Prior to procedure, discussed risks of blister formation, small wound, skin dyspigmentation, or rare scar following cryotherapy. Recommend Vaseline ointment to treated areas while healing.  ? ?Scar ?L lower leg ? ?With Hyperpigmentation ? ?Recommend starting Hydroquinone 4% cr bid to aa left lower leg up to 3 months ?Recommend SPF of 30 or greater qam if area sun-exposed ? ? ? ?hydroquinone 4 % cream - L lower leg ?Apply topically 2 (two) times daily. Bid to aa left lower leg for up to 3 months ? ?Nevus ?R upper post thigh ? ?Benign-appearing.  Observation.  Call clinic for new or changing moles.  Recommend daily use of broad spectrum spf 30+ sunscreen to sun-exposed areas.   ? ? ?Lentigines ?- Scattered tan macules ?- Due to sun exposure ?- Benign-appearing, observe ?- Recommend daily broad spectrum sunscreen SPF 30+ to sun-exposed areas, reapply every 2 hours as needed. ?- Call for any changes ?- back, arms ? ?Seborrheic Keratoses ?- Stuck-on, waxy, tan-brown papules and/or plaques  ?- Benign-appearing ?- Discussed benign etiology and prognosis. ?- Observe ?- Call for any changes ?- back ? ?Melanocytic Nevi ?- Tan-brown and/or pink-flesh-colored symmetric macules and papules ?- Benign appearing on exam today ?- Observation ?- Call clinic for new or changing moles ?- Recommend daily  use of broad spectrum spf 30+ sunscreen to sun-exposed areas.  ? ?Hemangiomas ?- Red papules ?- Discussed benign nature ?- Observe ?- Call for any changes ?- abdomen ? ?Actinic Damage ?- Chronic condition, secondary to cumulative UV/sun exposure ?- diffuse scaly erythematous macules with underlying dyspigmentation ?- Recommend daily broad spectrum sunscreen SPF 30+ to sun-exposed areas, reapply every 2 hours as  needed.  ?- Staying in the shade or wearing long sleeves, sun glasses (UVA+UVB protection) and wide brim hats (4-inch brim around the entire circumference of the hat) are also recommended for sun protection.  ?- Call for new or changing lesions. ? ?Skin cancer screening performed today.  ? ?History of Basal Cell Carcinoma of the Skin ?- No evidence of recurrence today ?- Recommend regular full body skin exams ?- Recommend daily broad spectrum sunscreen SPF 30+ to sun-exposed areas, reapply every 2 hours as needed.  ?- Call if any new or changing lesions are noted between office visits  ?- L lower back medial to scar ? ?History of Dysplastic Nevi ?- No evidence of recurrence today ?- Recommend regular full body skin exams ?- Recommend daily broad spectrum sunscreen SPF 30+ to sun-exposed areas, reapply every 2 hours as needed.  ?- Call if any new or changing lesions are noted between office visits  ?- L post hip, R lower back ? ?Return in about 1 year (around 11/12/2022) for TBSE, Hx of BCC, Hx of Dysplastic nevi. ? ?I, Othelia Pulling, RMA, am acting as scribe for Brendolyn Patty, MD . ? ?Documentation: I have reviewed the above documentation for accuracy and completeness, and I agree with the above. ? ?Brendolyn Patty MD  ? ?

## 2021-11-11 NOTE — Patient Instructions (Addendum)

## 2021-11-20 ENCOUNTER — Other Ambulatory Visit: Payer: Self-pay | Admitting: Internal Medicine

## 2021-11-20 DIAGNOSIS — Z1231 Encounter for screening mammogram for malignant neoplasm of breast: Secondary | ICD-10-CM

## 2021-12-16 DIAGNOSIS — M7989 Other specified soft tissue disorders: Secondary | ICD-10-CM | POA: Diagnosis not present

## 2021-12-16 DIAGNOSIS — R350 Frequency of micturition: Secondary | ICD-10-CM | POA: Diagnosis not present

## 2021-12-16 DIAGNOSIS — R8290 Unspecified abnormal findings in urine: Secondary | ICD-10-CM | POA: Diagnosis not present

## 2021-12-16 DIAGNOSIS — N390 Urinary tract infection, site not specified: Secondary | ICD-10-CM | POA: Diagnosis not present

## 2021-12-16 DIAGNOSIS — S99911A Unspecified injury of right ankle, initial encounter: Secondary | ICD-10-CM | POA: Diagnosis not present

## 2021-12-31 DIAGNOSIS — M810 Age-related osteoporosis without current pathological fracture: Secondary | ICD-10-CM | POA: Diagnosis not present

## 2022-01-01 ENCOUNTER — Ambulatory Visit
Admission: RE | Admit: 2022-01-01 | Discharge: 2022-01-01 | Disposition: A | Discharge status: Home / Self Care | Payer: PPO | Source: Ambulatory Visit | Attending: Internal Medicine | Admitting: Internal Medicine

## 2022-01-01 DIAGNOSIS — Z1231 Encounter for screening mammogram for malignant neoplasm of breast: Secondary | ICD-10-CM | POA: Diagnosis not present

## 2022-01-06 DIAGNOSIS — M810 Age-related osteoporosis without current pathological fracture: Secondary | ICD-10-CM | POA: Diagnosis not present

## 2022-03-06 DIAGNOSIS — H25811 Combined forms of age-related cataract, right eye: Secondary | ICD-10-CM | POA: Diagnosis not present

## 2022-03-06 DIAGNOSIS — Z01818 Encounter for other preprocedural examination: Secondary | ICD-10-CM | POA: Diagnosis not present

## 2022-04-04 DIAGNOSIS — H269 Unspecified cataract: Secondary | ICD-10-CM | POA: Diagnosis not present

## 2022-04-04 DIAGNOSIS — H25811 Combined forms of age-related cataract, right eye: Secondary | ICD-10-CM | POA: Diagnosis not present

## 2022-04-04 DIAGNOSIS — H2511 Age-related nuclear cataract, right eye: Secondary | ICD-10-CM | POA: Diagnosis not present

## 2022-04-30 DIAGNOSIS — Z79899 Other long term (current) drug therapy: Secondary | ICD-10-CM | POA: Diagnosis not present

## 2022-04-30 DIAGNOSIS — E7800 Pure hypercholesterolemia, unspecified: Secondary | ICD-10-CM | POA: Diagnosis not present

## 2022-04-30 DIAGNOSIS — M810 Age-related osteoporosis without current pathological fracture: Secondary | ICD-10-CM | POA: Diagnosis not present

## 2022-04-30 DIAGNOSIS — I10 Essential (primary) hypertension: Secondary | ICD-10-CM | POA: Diagnosis not present

## 2022-04-30 DIAGNOSIS — E559 Vitamin D deficiency, unspecified: Secondary | ICD-10-CM | POA: Diagnosis not present

## 2022-04-30 DIAGNOSIS — Z0000 Encounter for general adult medical examination without abnormal findings: Secondary | ICD-10-CM | POA: Diagnosis not present

## 2022-04-30 DIAGNOSIS — R8290 Unspecified abnormal findings in urine: Secondary | ICD-10-CM | POA: Diagnosis not present

## 2022-07-07 ENCOUNTER — Ambulatory Visit: Payer: PPO | Admitting: Dermatology

## 2022-10-30 ENCOUNTER — Other Ambulatory Visit: Payer: Self-pay | Admitting: Internal Medicine

## 2022-10-30 DIAGNOSIS — E559 Vitamin D deficiency, unspecified: Secondary | ICD-10-CM | POA: Diagnosis not present

## 2022-10-30 DIAGNOSIS — I10 Essential (primary) hypertension: Secondary | ICD-10-CM | POA: Diagnosis not present

## 2022-10-30 DIAGNOSIS — E7800 Pure hypercholesterolemia, unspecified: Secondary | ICD-10-CM | POA: Diagnosis not present

## 2022-10-30 DIAGNOSIS — Z0000 Encounter for general adult medical examination without abnormal findings: Secondary | ICD-10-CM | POA: Diagnosis not present

## 2022-10-30 DIAGNOSIS — M2550 Pain in unspecified joint: Secondary | ICD-10-CM | POA: Diagnosis not present

## 2022-10-30 DIAGNOSIS — M25561 Pain in right knee: Secondary | ICD-10-CM | POA: Diagnosis not present

## 2022-10-30 DIAGNOSIS — M810 Age-related osteoporosis without current pathological fracture: Secondary | ICD-10-CM | POA: Diagnosis not present

## 2022-10-30 DIAGNOSIS — Z1231 Encounter for screening mammogram for malignant neoplasm of breast: Secondary | ICD-10-CM | POA: Diagnosis not present

## 2022-10-30 DIAGNOSIS — R61 Generalized hyperhidrosis: Secondary | ICD-10-CM | POA: Diagnosis not present

## 2022-10-30 DIAGNOSIS — Z79899 Other long term (current) drug therapy: Secondary | ICD-10-CM | POA: Diagnosis not present

## 2022-11-18 ENCOUNTER — Ambulatory Visit (INDEPENDENT_AMBULATORY_CARE_PROVIDER_SITE_OTHER): Payer: PPO | Admitting: Dermatology

## 2022-11-18 DIAGNOSIS — Z85828 Personal history of other malignant neoplasm of skin: Secondary | ICD-10-CM

## 2022-11-18 DIAGNOSIS — L814 Other melanin hyperpigmentation: Secondary | ICD-10-CM | POA: Diagnosis not present

## 2022-11-18 DIAGNOSIS — L820 Inflamed seborrheic keratosis: Secondary | ICD-10-CM

## 2022-11-18 DIAGNOSIS — Z86018 Personal history of other benign neoplasm: Secondary | ICD-10-CM

## 2022-11-18 DIAGNOSIS — L821 Other seborrheic keratosis: Secondary | ICD-10-CM | POA: Diagnosis not present

## 2022-11-18 DIAGNOSIS — L578 Other skin changes due to chronic exposure to nonionizing radiation: Secondary | ICD-10-CM

## 2022-11-18 DIAGNOSIS — D229 Melanocytic nevi, unspecified: Secondary | ICD-10-CM

## 2022-11-18 DIAGNOSIS — D2272 Melanocytic nevi of left lower limb, including hip: Secondary | ICD-10-CM | POA: Diagnosis not present

## 2022-11-18 DIAGNOSIS — Z1283 Encounter for screening for malignant neoplasm of skin: Secondary | ICD-10-CM | POA: Diagnosis not present

## 2022-11-18 DIAGNOSIS — D2271 Melanocytic nevi of right lower limb, including hip: Secondary | ICD-10-CM | POA: Diagnosis not present

## 2022-11-18 DIAGNOSIS — D1801 Hemangioma of skin and subcutaneous tissue: Secondary | ICD-10-CM

## 2022-11-18 DIAGNOSIS — L905 Scar conditions and fibrosis of skin: Secondary | ICD-10-CM

## 2022-11-18 NOTE — Progress Notes (Signed)
Follow-Up Visit   Subjective  Sonya Schmidt is a 72 y.o. female who presents for the following: Annual Exam.  The patient presents for Total-Body Skin Exam (TBSE) for skin cancer screening and mole check.  The patient has spots, moles and lesions to be evaluated, some may be new or changing. She has a few new spots on her chest that she would like checked/treated. Some spots are irritated. She has a history of BCC and dysplastic nevi.   The following portions of the chart were reviewed this encounter and updated as appropriate:       Review of Systems:  No other skin or systemic complaints except as noted in HPI or Assessment and Plan.  Objective  Well appearing patient in no apparent distress; mood and affect are within normal limits.  A full examination was performed including scalp, head, eyes, ears, nose, lips, neck, chest, axillae, abdomen, back, buttocks, bilateral upper extremities, bilateral lower extremities, hands, feet, fingers, toes, fingernails, and toenails. All findings within normal limits unless otherwise noted below.  Right Paranasal 6.54mm flesh tan pap R paranasal - stable    Right Upper PosteriorThigh 2.0 mm medium dark brown macule    Right Popliteal 5.0 mm two-toned brown macule  Left lateral plantar foot 6 mm tan thin papule  R clavicle x 1,  L chest x 1, R upper breast x 1, L lat breast x 1, L hand dorsum x 1, R mid cheek x 1 (6) Erythematous stuck-on, waxy papule or plaque  Left Lower Leg Smooth hyperpigmented patch        Assessment & Plan  Skin cancer screening performed today.  Actinic Damage - chronic, secondary to cumulative UV radiation exposure/sun exposure over time - diffuse scaly erythematous macules with underlying dyspigmentation - Recommend daily broad spectrum sunscreen SPF 30+ to sun-exposed areas, reapply every 2 hours as needed.  - Recommend staying in the shade or wearing long sleeves, sun glasses (UVA+UVB protection)  and wide brim hats (4-inch brim around the entire circumference of the hat). - Call for new or changing lesions.  History of Basal Cell Carcinoma of the Skin - No evidence of recurrence today of the left lower back medial to scar - Recommend regular full body skin exams - Recommend daily broad spectrum sunscreen SPF 30+ to sun-exposed areas, reapply every 2 hours as needed.  - Call if any new or changing lesions are noted between office visits  History of Dysplastic Nevi - No evidence of recurrence today of the left posterior hip and right lower back - Recommend regular full body skin exams - Recommend daily broad spectrum sunscreen SPF 30+ to sun-exposed areas, reapply every 2 hours as needed.  - Call if any new or changing lesions are noted between office visits  Hemangiomas - Red papules - Discussed benign nature - Observe - Call for any changes  Lentigines - Scattered tan macules - Due to sun exposure - Benign-appearing, observe - Recommend daily broad spectrum sunscreen SPF 30+ to sun-exposed areas, reapply every 2 hours as needed. - Call for any changes Counseling for BBL / IPL / Laser and Coordination of Care Discussed the treatment option of Broad Band Light (BBL) /Intense Pulsed Light (IPL)/ Laser for skin discoloration, including brown spots and redness.  Typically we recommend at least 1-3 treatment sessions about 5-8 weeks apart for best results.  Cannot have tanned skin when BBL performed, and regular use of sunscreen is advised after the procedure to help maintain results.  The patient's condition may also require "maintenance treatments" in the future.  The fee for BBL / laser treatments is $350 per treatment session for the whole face.  A fee can be quoted for other parts of the body.  Insurance typically does not pay for BBL/laser treatments and therefore the fee is an out-of-pocket cost.   Seborrheic Keratoses - Stuck-on, waxy, tan-brown papules and/or plaques  -  Benign-appearing - Discussed benign etiology and prognosis. - Observe - Call for any changes  Melanocytic Nevi - Tan-brown and/or pink-flesh-colored symmetric macules and papules - Benign appearing on exam today - Observation - Call clinic for new or changing moles - Recommend daily use of broad spectrum spf 30+ sunscreen to sun-exposed areas.   Seborrheic keratosis Right Paranasal  Benign-appearing.  Observation.  Call clinic for new or changing lesions.  Recommend daily use of broad spectrum spf 30+ sunscreen to sun-exposed areas.    Nevus (3) Right Popliteal; Left lateral plantar foot; Right Upper PosteriorThigh  Benign-appearing, stable.  Observation.  Call clinic for new or changing moles.  Recommend daily use of broad spectrum spf 30+ sunscreen to sun-exposed areas.   Inflamed seborrheic keratosis (6) R clavicle x 1,  L chest x 1, R upper breast x 1, L lat breast x 1, L hand dorsum x 1, R mid cheek x 1  Symptomatic, irritating, patient would like treated.  Destruction of lesion - R clavicle x 1,  L chest x 1, R upper breast x 1, L lat breast x 1, L hand dorsum x 1, R mid cheek x 1  Destruction method: cryotherapy   Informed consent: discussed and consent obtained   Lesion destroyed using liquid nitrogen: Yes   Region frozen until ice ball extended beyond lesion: Yes   Outcome: patient tolerated procedure well with no complications   Post-procedure details: wound care instructions given   Additional details:  Prior to procedure, discussed risks of blister formation, small wound, skin dyspigmentation, or rare scar following cryotherapy. Recommend Vaseline ointment to treated areas while healing.   Scar Left Lower Leg  With post-inflammatory hyperpigmentation. H/o trauma to area in past.   No improvement with 4% hydroquinone cream.   Start Hydroquinone: 8%, Tretinoin: 0.1%, Kojic Acid: 1%, Niacinamide: 4%, Fluocinolone: 0.025% Cream Apply to dark spot on lower leg BID  no longer than 3 months. Rx sent to [facility].   Recommend daily broad spectrum sunscreen SPF 30+ to aa area, reapply every 2 hours as needed. Call for new or changing lesions.    Discussed lower leg takes long time to heal and scar may always be somewhat darkened despite treatment    Related Medications hydroquinone 4 % cream Apply topically 2 (two) times daily. Bid to aa left lower leg for up to 3 months   Return in about 1 year (around 11/18/2023) for TBSE. BBL to face in fall/winter.Lindi Adie, CMA, am acting as scribe for Brendolyn Patty, MD .  Documentation: I have reviewed the above documentation for accuracy and completeness, and I agree with the above.  Brendolyn Patty MD

## 2022-11-18 NOTE — Patient Instructions (Addendum)
Cryotherapy Aftercare  Wash gently with soap and water everyday.   Apply Vaseline and Band-Aid daily until healed.    Instructions for [facility] Medications  One or more of your medications was sent to the [facility] mail order compounding pharmacy. You will receive an email from them and can purchase the medicine through that link. It will then be mailed to your home at the address you confirmed. If for any reason you do not receive an email from them, please check your spam folder. If you still do not find the email, please let us know. [facility] phone number is [phone].    Seborrheic Keratosis  What causes seborrheic keratoses? Seborrheic keratoses are harmless, common skin growths that first appear during adult life.  As time goes by, more growths appear.  Some people may develop a large number of them.  Seborrheic keratoses appear on both covered and uncovered body parts.  They are not caused by sunlight.  The tendency to develop seborrheic keratoses can be inherited.  They vary in color from skin-colored to gray, brown, or even black.  They can be either smooth or have a rough, warty surface.   Seborrheic keratoses are superficial and look as if they were stuck on the skin.  Under the microscope this type of keratosis looks like layers upon layers of skin.  That is why at times the top layer may seem to fall off, but the rest of the growth remains and re-grows.    Treatment Seborrheic keratoses do not need to be treated, but can easily be removed in the office.  Seborrheic keratoses often cause symptoms when they rub on clothing or jewelry.  Lesions can be in the way of shaving.  If they become inflamed, they can cause itching, soreness, or burning.  Removal of a seborrheic keratosis can be accomplished by freezing, burning, or surgery. If any spot bleeds, scabs, or grows rapidly, please return to have it checked, as these can be an indication of a skin  cancer.   Counseling for BBL / IPL / Laser and Coordination of Care Discussed the treatment option of Broad Band Light (BBL) /Intense Pulsed Light (IPL)/ Laser for skin discoloration, including brown spots and redness.  Typically we recommend at least 1-3 treatment sessions about 5-8 weeks apart for best results.  Cannot have tanned skin when BBL performed, and regular use of sunscreen is advised after the procedure to help maintain results. The patient's condition may also require "maintenance treatments" in the future.  The fee for BBL / laser treatments is $350 per treatment session for the whole face.  A fee can be quoted for other parts of the body.  Insurance typically does not pay for BBL/laser treatments and therefore the fee is an out-of-pocket cost.    Due to recent changes in healthcare laws, you may see results of your pathology and/or laboratory studies on MyChart before the doctors have had a chance to review them. We understand that in some cases there may be results that are confusing or concerning to you. Please understand that not all results are received at the same time and often the doctors may need to interpret multiple results in order to provide you with the best plan of care or course of treatment. Therefore, we ask that you please give [place] 2 business days to thoroughly review all your results before contacting the office for clarification. Should we see a critical lab result, you will be contacted sooner.  If You Need Anything After Your Visit  If you have any questions or concerns for your doctor, please call our main line at [phone] and press option 4 to reach your doctor's medical assistant. If no one answers, please leave a voicemail as directed and we will return your call as soon as possible. Messages left after 4 pm will be answered the following business day.   You may also send [place] a message via [place]. We typically respond to MyChart messages within 1-2  business days.  For prescription refills, please ask your pharmacy to contact our office. Our fax number is [phone].  If you have an urgent issue when the clinic is closed that cannot wait until the next business day, you can page your doctor at the number below.    Please note that while we do our best to be available for urgent issues outside of office hours, we are not available 24/7.   If you have an urgent issue and are unable to reach [place], you may choose to seek medical care at your doctor's office, retail clinic, urgent care center, or emergency room.  If you have a medical emergency, please immediately call 911 or go to the emergency department.  Pager Numbers  - Dr. Nehemiah Massed: [phone]  - Dr. Laurence Ferrari: [phone]  - Dr. Nicole Kindred: [phone]  In the event of inclement weather, please call our main line at [phone] for an update on the status of any delays or closures.  Dermatology Medication Tips: Please keep the boxes that topical medications come in in order to help keep track of the instructions about where and how to use these. Pharmacies typically print the medication instructions only on the boxes and not directly on the medication tubes.   If your medication is too expensive, please contact our office at [phone] option 4 or send [place] a message through [place].   We are unable to tell what your co-pay for medications will be in advance as this is different depending on your insurance coverage. However, we may be able to find a substitute medication at lower cost or fill out paperwork to get insurance to cover a needed medication.   If a prior authorization is required to get your medication covered by your insurance company, please allow [place] 1-2 business days to complete this process.  Drug prices often vary depending on where the prescription is filled and some pharmacies may offer cheaper prices.  The website www.goodrx.com contains coupons for medications  through different pharmacies. The prices here do not account for what the cost may be with help from insurance (it may be cheaper with your insurance), but the website can give you the price if you did not use any insurance.  - You can print the associated coupon and take it with your prescription to the pharmacy.  - You may also stop by our office during regular business hours and pick up a GoodRx coupon card.  - If you need your prescription sent electronically to a different pharmacy, notify our office through [facility] or by phone at [phone] option 4.     Si Usted Necesita Algo Despus de Su Visita  Tambin puede enviarnos un mensaje a travs de Pharmacist, community. Por lo general respondemos a los mensajes de MyChart en el transcurso de 1 a 2 das hbiles.  Para renovar recetas, por favor pida a su farmacia que se ponga en contacto con nuestra oficina. Harland Dingwall de fax es [place] [phone].  Si  tiene un asunto urgente cuando la clnica est cerrada y que no puede esperar hasta el siguiente da hbil, puede llamar/localizar a su doctor(a) al nmero que aparece a continuacin.   Por favor, tenga en cuenta que aunque hacemos todo lo posible para estar disponibles para asuntos urgentes fuera del horario de [place], no estamos disponibles las 24 horas del da, los 7 das de la [place].   Si tiene un problema urgente y no puede comunicarse con nosotros, puede optar por buscar atencin mdica  en el consultorio de su doctor(a), en una clnica privada, en un centro de atencin urgente o en una sala de emergencias.  Si tiene Engineering geologist, por favor llame inmediatamente al 911 o vaya a la sala de emergencias.  Nmeros de bper  - Dr. Nehemiah Massed: [phone]  - Dra. Moye: [phone]  - Dra. Nicole Kindred: [phone]  En caso de inclemencias del [place], por favor llame a Johnsie Kindred principal al [phone] para una actualizacin sobre el [place] de cualquier retraso o  cierre.  Consejos para la medicacin en dermatologa: Por favor, guarde las cajas en las que vienen los medicamentos de uso tpico para ayudarle a seguir las instrucciones sobre dnde y cmo usarlos. Las farmacias generalmente imprimen las instrucciones del medicamento slo en las cajas y no directamente en los tubos del [place].   Si su medicamento es muy caro, por favor, pngase en contacto con Zigmund Daniel llamando al [phone] y presione la opcin 4 o envenos un mensaje a travs de Pharmacist, community.   No podemos decirle cul ser su copago por los medicamentos por adelantado ya que esto es diferente dependiendo de la cobertura de su seguro. Sin embargo, es posible que podamos encontrar un medicamento sustituto a Electrical engineer un formulario para que el seguro cubra el medicamento que se considera necesario.   Si se requiere una autorizacin previa para que su compaa de seguros [place] su medicamento, por favor permtanos de 1 a 2 das hbiles para completar este proceso.  Los precios de los medicamentos varan con frecuencia dependiendo del Environmental consultant de dnde se surte la receta y alguna farmacias pueden ofrecer precios ms baratos.  El sitio web www.goodrx.com tiene cupones para medicamentos de Airline pilot. Los precios aqu no tienen en cuenta lo que podra costar con la ayuda del seguro (puede ser ms barato con su seguro), pero el sitio web puede darle el precio si no utiliz Research scientist (physical sciences).  - Puede imprimir el cupn correspondiente y llevarlo con su receta a la farmacia.  - Tambin puede pasar por nuestra oficina durante el horario de atencin regular y Charity fundraiser una tarjeta de cupones de GoodRx.  - Si necesita que su receta se enve electrnicamente a una farmacia diferente, informe a nuestra oficina a travs de MyChart de [place] o por telfono llamando al [phone] y presione la opcin 4.

## 2023-01-01 DIAGNOSIS — M810 Age-related osteoporosis without current pathological fracture: Secondary | ICD-10-CM | POA: Diagnosis not present

## 2023-01-13 DIAGNOSIS — Z03818 Encounter for observation for suspected exposure to other biological agents ruled out: Secondary | ICD-10-CM | POA: Diagnosis not present

## 2023-01-13 DIAGNOSIS — J020 Streptococcal pharyngitis: Secondary | ICD-10-CM | POA: Diagnosis not present

## 2023-01-15 DIAGNOSIS — M810 Age-related osteoporosis without current pathological fracture: Secondary | ICD-10-CM | POA: Diagnosis not present

## 2023-02-11 ENCOUNTER — Ambulatory Visit
Admission: RE | Admit: 2023-02-11 | Discharge: 2023-02-11 | Disposition: A | Discharge status: Home / Self Care | Payer: PPO | Source: Ambulatory Visit | Attending: Internal Medicine | Admitting: Internal Medicine

## 2023-02-11 DIAGNOSIS — Z1231 Encounter for screening mammogram for malignant neoplasm of breast: Secondary | ICD-10-CM | POA: Diagnosis not present

## 2023-03-16 DIAGNOSIS — H903 Sensorineural hearing loss, bilateral: Secondary | ICD-10-CM | POA: Diagnosis not present

## 2023-03-16 DIAGNOSIS — H6123 Impacted cerumen, bilateral: Secondary | ICD-10-CM | POA: Diagnosis not present

## 2023-04-22 DIAGNOSIS — H26491 Other secondary cataract, right eye: Secondary | ICD-10-CM | POA: Diagnosis not present

## 2023-05-05 DIAGNOSIS — Z0000 Encounter for general adult medical examination without abnormal findings: Secondary | ICD-10-CM | POA: Diagnosis not present

## 2023-05-05 DIAGNOSIS — E7800 Pure hypercholesterolemia, unspecified: Secondary | ICD-10-CM | POA: Diagnosis not present

## 2023-05-05 DIAGNOSIS — M549 Dorsalgia, unspecified: Secondary | ICD-10-CM | POA: Diagnosis not present

## 2023-05-05 DIAGNOSIS — E559 Vitamin D deficiency, unspecified: Secondary | ICD-10-CM | POA: Diagnosis not present

## 2023-05-05 DIAGNOSIS — M810 Age-related osteoporosis without current pathological fracture: Secondary | ICD-10-CM | POA: Diagnosis not present

## 2023-05-05 DIAGNOSIS — M5136 Other intervertebral disc degeneration, lumbar region: Secondary | ICD-10-CM | POA: Diagnosis not present

## 2023-05-05 DIAGNOSIS — Z79899 Other long term (current) drug therapy: Secondary | ICD-10-CM | POA: Diagnosis not present

## 2023-05-05 DIAGNOSIS — M5489 Other dorsalgia: Secondary | ICD-10-CM | POA: Diagnosis not present

## 2023-05-05 DIAGNOSIS — I10 Essential (primary) hypertension: Secondary | ICD-10-CM | POA: Diagnosis not present

## 2023-05-05 DIAGNOSIS — Z23 Encounter for immunization: Secondary | ICD-10-CM | POA: Diagnosis not present

## 2023-06-10 ENCOUNTER — Telehealth: Payer: Self-pay

## 2023-06-10 NOTE — Telephone Encounter (Signed)
Left pt message to call about upcoming BBL appt on 06/22/23.  Need to discuss Valtrex protocol and send in for patient if she has history of fever blisters.Marguerite Olea

## 2023-06-22 ENCOUNTER — Ambulatory Visit: Payer: PPO | Admitting: Dermatology

## 2023-08-05 DIAGNOSIS — E7800 Pure hypercholesterolemia, unspecified: Secondary | ICD-10-CM | POA: Diagnosis not present

## 2023-09-18 DIAGNOSIS — H6123 Impacted cerumen, bilateral: Secondary | ICD-10-CM | POA: Diagnosis not present

## 2023-09-18 DIAGNOSIS — H903 Sensorineural hearing loss, bilateral: Secondary | ICD-10-CM | POA: Diagnosis not present

## 2023-11-02 ENCOUNTER — Ambulatory Visit: Payer: PPO | Admitting: Dermatology

## 2023-11-02 DIAGNOSIS — Z0000 Encounter for general adult medical examination without abnormal findings: Secondary | ICD-10-CM | POA: Diagnosis not present

## 2023-11-02 DIAGNOSIS — E559 Vitamin D deficiency, unspecified: Secondary | ICD-10-CM | POA: Diagnosis not present

## 2023-11-02 DIAGNOSIS — M810 Age-related osteoporosis without current pathological fracture: Secondary | ICD-10-CM | POA: Diagnosis not present

## 2023-11-02 DIAGNOSIS — Z79899 Other long term (current) drug therapy: Secondary | ICD-10-CM | POA: Diagnosis not present

## 2023-11-02 DIAGNOSIS — I10 Essential (primary) hypertension: Secondary | ICD-10-CM | POA: Diagnosis not present

## 2023-11-02 DIAGNOSIS — Z1231 Encounter for screening mammogram for malignant neoplasm of breast: Secondary | ICD-10-CM | POA: Diagnosis not present

## 2023-11-02 DIAGNOSIS — E7800 Pure hypercholesterolemia, unspecified: Secondary | ICD-10-CM | POA: Diagnosis not present

## 2023-11-03 ENCOUNTER — Other Ambulatory Visit: Payer: Self-pay | Admitting: Internal Medicine

## 2023-11-03 DIAGNOSIS — Z1231 Encounter for screening mammogram for malignant neoplasm of breast: Secondary | ICD-10-CM

## 2023-11-16 ENCOUNTER — Encounter: Payer: Self-pay | Admitting: Dermatology

## 2023-11-16 ENCOUNTER — Ambulatory Visit (INDEPENDENT_AMBULATORY_CARE_PROVIDER_SITE_OTHER): Payer: PPO | Admitting: Dermatology

## 2023-11-16 DIAGNOSIS — L821 Other seborrheic keratosis: Secondary | ICD-10-CM

## 2023-11-16 DIAGNOSIS — L810 Postinflammatory hyperpigmentation: Secondary | ICD-10-CM

## 2023-11-16 DIAGNOSIS — Z1283 Encounter for screening for malignant neoplasm of skin: Secondary | ICD-10-CM

## 2023-11-16 DIAGNOSIS — D2272 Melanocytic nevi of left lower limb, including hip: Secondary | ICD-10-CM | POA: Diagnosis not present

## 2023-11-16 DIAGNOSIS — L820 Inflamed seborrheic keratosis: Secondary | ICD-10-CM

## 2023-11-16 DIAGNOSIS — L814 Other melanin hyperpigmentation: Secondary | ICD-10-CM

## 2023-11-16 DIAGNOSIS — W908XXA Exposure to other nonionizing radiation, initial encounter: Secondary | ICD-10-CM

## 2023-11-16 DIAGNOSIS — D1801 Hemangioma of skin and subcutaneous tissue: Secondary | ICD-10-CM

## 2023-11-16 DIAGNOSIS — D2271 Melanocytic nevi of right lower limb, including hip: Secondary | ICD-10-CM

## 2023-11-16 DIAGNOSIS — Z85828 Personal history of other malignant neoplasm of skin: Secondary | ICD-10-CM

## 2023-11-16 DIAGNOSIS — D229 Melanocytic nevi, unspecified: Secondary | ICD-10-CM | POA: Diagnosis not present

## 2023-11-16 DIAGNOSIS — L905 Scar conditions and fibrosis of skin: Secondary | ICD-10-CM

## 2023-11-16 DIAGNOSIS — L578 Other skin changes due to chronic exposure to nonionizing radiation: Secondary | ICD-10-CM

## 2023-11-16 DIAGNOSIS — Z86018 Personal history of other benign neoplasm: Secondary | ICD-10-CM

## 2023-11-16 MED ORDER — HYDROQUINONE 8 % EX EMUL: 1.0000 | Freq: Every day | CUTANEOUS | 1 refills | Status: AC

## 2023-11-16 NOTE — Progress Notes (Signed)
 Follow-Up Visit   Subjective  Sonya Schmidt is a 73 y.o. female who presents for the following: Skin Cancer Screening and Full Body Skin Exam, hx of BCC, Dysplastic Nevi, scar L lower leg, pt could not get [facility] for pigmented scar, check irritated spots trunk  The patient presents for Total-Body Skin Exam (TBSE) for skin cancer screening and mole check. The patient has spots, moles and lesions to be evaluated, some may be new or changing and the patient may have concern these could be cancer.    The following portions of the chart were reviewed this encounter and updated as appropriate: medications, allergies, medical history  Review of Systems:  No other skin or systemic complaints except as noted in HPI or Assessment and Plan.  Objective  Well appearing patient in no apparent distress; mood and affect are within normal limits.  A full examination was performed including scalp, head, eyes, ears, nose, lips, neck, chest, axillae, abdomen, back, buttocks, bilateral upper extremities, bilateral lower extremities, hands, feet, fingers, toes, fingernails, and toenails. All findings within normal limits unless otherwise noted below.   Relevant physical exam findings are noted in the Assessment and Plan.  R upper pretibia x 1, L hip x 2, R medial thigh x 1, L mid back at braline x 1, L post upper arm x 1 (6) Stuck on waxy paps with erythema   Assessment & Plan   SKIN CANCER SCREENING PERFORMED TODAY.  ACTINIC DAMAGE - Chronic condition, secondary to cumulative UV/sun exposure - diffuse scaly erythematous macules with underlying dyspigmentation - Recommend daily broad spectrum sunscreen SPF 30+ to sun-exposed areas, reapply every 2 hours as needed.  - Staying in the shade or wearing long sleeves, sun glasses (UVA+UVB protection) and wide brim hats (4-inch brim around the entire circumference of the hat) are also recommended for sun protection.  - Call for  new or changing lesions.  LENTIGINES, SEBORRHEIC KERATOSES, HEMANGIOMAS - Benign normal skin lesions - Benign-appearing - Call for any changes  MELANOCYTIC NEVI - Tan-brown and/or pink-flesh-colored symmetric macules and papules - R popliteal 5.10mm two toned brown macule - R upper post thigh 2.91mm medium dark brown macule - L lat plantar foot 6.50mm tan papule - Benign appearing on exam today - Observation - Call clinic for new or changing moles - Recommend daily use of broad spectrum spf 30+ sunscreen to sun-exposed areas.  Benign-appearing. Stable compared to previous visit. Observation.  Call clinic for new or changing moles.  Recommend daily use of broad spectrum spf 30+ sunscreen to sun-exposed areas.    SEBORRHEIC KERATOSIS R paranasal Exam: R paranasal 5.25mm flesh tan pap  Treatment: Benign-appearing. Stable compared to previous visit. Observation.  Call clinic for new or changing moles.  Recommend daily use of broad spectrum spf 30+ sunscreen to sun-exposed areas.   HISTORY OF BASAL CELL CARCINOMA OF THE SKIN - No evidence of recurrence today- L lower back medial to scar - Recommend regular full body skin exams - Recommend daily broad spectrum sunscreen SPF 30+ to sun-exposed areas, reapply every 2 hours as needed.  - Call if any new or changing lesions are noted between office visits    HISTORY OF DYSPLASTIC NEVUS No evidence of recurrence today- L post hip, R lower back Recommend regular full body skin exams Recommend daily broad spectrum sunscreen SPF 30+ to sun-exposed areas, reapply every 2 hours as needed.  Call if any new or changing lesions are noted between office visits  SCAR With PIH, hx of trauma in past L lower leg Exam: hyperpigmented patch left lower leg, see photo Benign-appearing.  Observation.  Call clinic for new or changing lesions. Recommend daily broad spectrum sunscreen SPF 30+, reapply every 2 hours as needed. Treatment: Start Hydroquinone:  8%, Tretinoin: 0.1%, Kojic Acid: 1%, Niacinamide: 4%, Fluocinolone: 0.025% Cream Apply to dark spot on lower leg qhs no longer than 3 months. Rx sent to [facility].   Reviewed risk of permanent dark spots (ochronosis) if hydroquinone is used longer than 3 months.   INFLAMED SEBORRHEIC KERATOSIS (6) R upper pretibia x 1, L hip x 2, R medial thigh x 1, L mid back at braline x 1, L post upper arm x 1 (6) Symptomatic, irritating, patient would like treated. Destruction of lesion - R upper pretibia x 1, L hip x 2, R medial thigh x 1, L mid back at braline x 1, L post upper arm x 1 (6)  Destruction method: cryotherapy   Informed consent: discussed and consent obtained   Lesion destroyed using liquid nitrogen: Yes   Region frozen until ice ball extended beyond lesion: Yes   Outcome: patient tolerated procedure well with no complications   Post-procedure details: wound care instructions given   Additional details:  Prior to procedure, discussed risks of blister formation, small wound, skin dyspigmentation, or rare scar following cryotherapy. Recommend Vaseline ointment to treated areas while healing.  Return in about 1 year (around 11/15/2024) for TBSE, Hx of BCC, Hx of Dysplastic nevi.  I, Ardis Rowan, RMA, am acting as scribe for Willeen Niece, MD .   Documentation: I have reviewed the above documentation for accuracy and completeness, and I agree with the above.  Willeen Niece, MD

## 2023-11-16 NOTE — Patient Instructions (Addendum)
Instructions for [facility] Medications  One or more of your medications was sent to the [facility] mail order compounding pharmacy. You will receive an email from them and can purchase the medicine through that link. It will then be mailed to your home at the address you confirmed. If for any reason you do not receive an email from them, please check your spam folder. If you still do not find the email, please let us know. [facility] phone number is [phone].     Cryotherapy Aftercare  Wash gently with soap and water everyday.   Apply Vaseline and Band-Aid daily until healed.    Due to recent changes in healthcare laws, you may see results of your pathology and/or laboratory studies on MyChart before the doctors have had a chance to review them. We understand that in some cases there may be results that are confusing or concerning to you. Please understand that not all results are received at the same time and often the doctors may need to interpret multiple results in order to provide you with the best plan of care or course of treatment. Therefore, we ask that you please give [place] 2 business days to thoroughly review all your results before contacting the office for clarification. Should we see a critical lab result, you will be contacted sooner.   If You Need Anything After Your Visit  If you have any questions or concerns for your doctor, please call our main line at [phone] and press option 4 to reach your doctor's medical assistant. If no one answers, please leave a voicemail as directed and we will return your call as soon as possible. Messages left after 4 pm will be answered the following business day.   You may also send [place] a message via MyChart. We typically respond to MyChart messages within 1-2 business days.  For prescription refills, please ask your pharmacy to contact our office. Our fax number is [phone].  If you have an urgent issue when the clinic  is closed that cannot wait until the next business day, you can page your doctor at the number below.    Please note that while we do our best to be available for urgent issues outside of office hours, we are not available 24/7.   If you have an urgent issue and are unable to reach [place], you may choose to seek medical care at your doctor's office, retail clinic, urgent care center, or emergency room.  If you have a medical emergency, please immediately call 911 or go to the emergency department.  Pager Numbers  - Dr. Gwen Pounds: [phone]  - Dr. Roseanne Reno: [phone]  - Dr. Katrinka Blazing: [phone]   In the event of inclement weather, please call our main line at [phone] for an update on the status of any delays or closures.  Dermatology Medication Tips: Please keep the boxes that topical medications come in in order to help keep track of the instructions about where and how to use these. Pharmacies typically print the medication instructions only on the boxes and not directly on the medication tubes.   If your medication is too expensive, please contact our office at [phone] option 4 or send [place] a message through MyChart.   We are unable to tell what your co-pay for medications will be in advance as this is different depending on your insurance coverage. However, we may be able to find a substitute medication at lower cost or fill out paperwork to get insurance  to cover a needed medication.   If a prior authorization is required to get your medication covered by your insurance company, please allow [place] 1-2 business days to complete this process.  Drug prices often vary depending on where the prescription is filled and some pharmacies may offer cheaper prices.  The website www.goodrx.com contains coupons for medications through different pharmacies. The prices here do not account for what the cost may be with help from insurance (it may be cheaper with your insurance), but the website  can give you the price if you did not use any insurance.  - You can print the associated coupon and take it with your prescription to the pharmacy.  - You may also stop by our office during regular business hours and pick up a GoodRx coupon card.  - If you need your prescription sent electronically to a different pharmacy, notify our office through [facility] or by phone at [phone] option 4.     Si Usted Necesita Algo Despus de Su Visita  Tambin puede enviarnos un mensaje a travs de Clinical cytogeneticist. Por lo general respondemos a los mensajes de MyChart en el transcurso de 1 a 2 das hbiles.  Para renovar recetas, por favor pida a su farmacia que se ponga en contacto con nuestra oficina. Annie Sable de fax es [place] [phone].  Si tiene un asunto urgente cuando la clnica est cerrada y que no puede esperar hasta el siguiente da hbil, puede llamar/localizar a su doctor(a) al nmero que aparece a continuacin.   Por favor, tenga en cuenta que aunque hacemos todo lo posible para estar disponibles para asuntos urgentes fuera del horario de [place], no estamos disponibles las 24 horas del da, los 7 [address]  [address] de la [place].   Si tiene un problema urgente y no puede comunicarse con nosotros, puede optar por buscar atencin mdica  en el consultorio de su doctor(a), en una clnica privada, en un centro de atencin urgente o en una sala de emergencias.  Si tiene Engineer, drilling, por favor llame inmediatamente al 911 o vaya a la sala de emergencias.  Nmeros de bper  - Dr. Gwen Pounds: [phone]  - Dra. Roseanne Reno: [phone]  - Dr. Katrinka Blazing: [phone]   En caso de inclemencias del tiempo, por favor llame a Lacy Duverney principal al [phone] para una actualizacin sobre el [place] de cualquier retraso o cierre.  Consejos para la medicacin en dermatologa: Por favor, guarde las cajas en las que vienen los medicamentos de uso tpico para ayudarle a seguir las instrucciones  sobre dnde y cmo usarlos. Las farmacias generalmente imprimen las instrucciones del medicamento slo en las cajas y no directamente en los tubos del [place].   Si su medicamento es muy caro, por favor, pngase en contacto con Rolm Gala llamando al [phone] y presione la opcin 4 o envenos un mensaje a travs de Clinical cytogeneticist.   No podemos decirle cul ser su copago por los medicamentos por adelantado ya que esto es diferente dependiendo de la cobertura de su seguro. Sin embargo, es posible que podamos encontrar un medicamento sustituto a Audiological scientist un formulario para que el seguro cubra el medicamento que se considera necesario.   Si se requiere una autorizacin previa para que su compaa de seguros [place] su medicamento, por favor permtanos de 1 a 2 das hbiles para completar [address]  Los precios de los medicamentos varan con frecuencia dependiendo del Environmental consultant de dnde se surte la receta y Careers adviser pueden ofrecer  precios ms baratos.  El sitio web www.goodrx.com tiene cupones para medicamentos de Health and safety inspector. Los precios aqu no tienen en cuenta lo que podra costar con la ayuda del seguro (puede ser ms barato con su seguro), pero el sitio web puede darle el precio si no utiliz Tourist information centre manager.  - Puede imprimir el cupn correspondiente y llevarlo con su receta a la farmacia.  - Tambin puede pasar por nuestra oficina durante el horario de atencin regular y Education officer, museum una tarjeta de cupones de GoodRx.  - Si necesita que su receta se enve electrnicamente a una farmacia diferente, informe a nuestra oficina a travs de MyChart de [place] o por telfono llamando al [phone] y presione la opcin 4.

## 2024-01-14 DIAGNOSIS — I10 Essential (primary) hypertension: Secondary | ICD-10-CM | POA: Diagnosis not present

## 2024-01-14 DIAGNOSIS — Z79899 Other long term (current) drug therapy: Secondary | ICD-10-CM | POA: Diagnosis not present

## 2024-01-14 DIAGNOSIS — R8290 Unspecified abnormal findings in urine: Secondary | ICD-10-CM | POA: Diagnosis not present

## 2024-01-14 DIAGNOSIS — E7800 Pure hypercholesterolemia, unspecified: Secondary | ICD-10-CM | POA: Diagnosis not present

## 2024-01-14 DIAGNOSIS — M810 Age-related osteoporosis without current pathological fracture: Secondary | ICD-10-CM | POA: Diagnosis not present

## 2024-01-21 DIAGNOSIS — N390 Urinary tract infection, site not specified: Secondary | ICD-10-CM | POA: Diagnosis not present

## 2024-01-21 DIAGNOSIS — M810 Age-related osteoporosis without current pathological fracture: Secondary | ICD-10-CM | POA: Diagnosis not present

## 2024-01-29 DIAGNOSIS — N390 Urinary tract infection, site not specified: Secondary | ICD-10-CM | POA: Diagnosis not present

## 2024-02-05 DIAGNOSIS — R8290 Unspecified abnormal findings in urine: Secondary | ICD-10-CM | POA: Diagnosis not present

## 2024-02-05 DIAGNOSIS — R399 Unspecified symptoms and signs involving the genitourinary system: Secondary | ICD-10-CM | POA: Diagnosis not present

## 2024-02-12 ENCOUNTER — Encounter

## 2024-02-17 DIAGNOSIS — R8290 Unspecified abnormal findings in urine: Secondary | ICD-10-CM | POA: Diagnosis not present

## 2024-02-17 DIAGNOSIS — R399 Unspecified symptoms and signs involving the genitourinary system: Secondary | ICD-10-CM | POA: Diagnosis not present

## 2024-02-18 ENCOUNTER — Ambulatory Visit
Admission: RE | Admit: 2024-02-18 | Discharge: 2024-02-18 | Disposition: A | Discharge status: Home / Self Care | Source: Ambulatory Visit | Attending: Internal Medicine | Admitting: Internal Medicine

## 2024-02-18 DIAGNOSIS — Z1231 Encounter for screening mammogram for malignant neoplasm of breast: Secondary | ICD-10-CM | POA: Diagnosis not present

## 2024-03-17 ENCOUNTER — Ambulatory Visit: Admitting: Dermatology

## 2024-03-17 ENCOUNTER — Encounter: Payer: Self-pay | Admitting: Dermatology

## 2024-03-17 DIAGNOSIS — S92309A Fracture of unspecified metatarsal bone(s), unspecified foot, initial encounter for closed fracture: Secondary | ICD-10-CM | POA: Insufficient documentation

## 2024-03-17 DIAGNOSIS — L570 Actinic keratosis: Secondary | ICD-10-CM | POA: Diagnosis not present

## 2024-03-17 DIAGNOSIS — D485 Neoplasm of uncertain behavior of skin: Secondary | ICD-10-CM | POA: Diagnosis not present

## 2024-03-17 DIAGNOSIS — M8580 Other specified disorders of bone density and structure, unspecified site: Secondary | ICD-10-CM | POA: Insufficient documentation

## 2024-03-17 DIAGNOSIS — K589 Irritable bowel syndrome without diarrhea: Secondary | ICD-10-CM | POA: Insufficient documentation

## 2024-03-17 DIAGNOSIS — D492 Neoplasm of unspecified behavior of bone, soft tissue, and skin: Secondary | ICD-10-CM | POA: Diagnosis not present

## 2024-03-17 DIAGNOSIS — L821 Other seborrheic keratosis: Secondary | ICD-10-CM

## 2024-03-17 DIAGNOSIS — W908XXA Exposure to other nonionizing radiation, initial encounter: Secondary | ICD-10-CM | POA: Diagnosis not present

## 2024-03-17 DIAGNOSIS — H9319 Tinnitus, unspecified ear: Secondary | ICD-10-CM | POA: Insufficient documentation

## 2024-03-17 DIAGNOSIS — N390 Urinary tract infection, site not specified: Secondary | ICD-10-CM | POA: Insufficient documentation

## 2024-03-17 DIAGNOSIS — I10 Essential (primary) hypertension: Secondary | ICD-10-CM | POA: Insufficient documentation

## 2024-03-17 DIAGNOSIS — D126 Benign neoplasm of colon, unspecified: Secondary | ICD-10-CM | POA: Insufficient documentation

## 2024-03-17 DIAGNOSIS — E7800 Pure hypercholesterolemia, unspecified: Secondary | ICD-10-CM | POA: Insufficient documentation

## 2024-03-17 DIAGNOSIS — M1990 Unspecified osteoarthritis, unspecified site: Secondary | ICD-10-CM | POA: Insufficient documentation

## 2024-03-17 DIAGNOSIS — Z9109 Other allergy status, other than to drugs and biological substances: Secondary | ICD-10-CM | POA: Insufficient documentation

## 2024-03-17 DIAGNOSIS — N951 Menopausal and female climacteric states: Secondary | ICD-10-CM | POA: Insufficient documentation

## 2024-03-17 DIAGNOSIS — D489 Neoplasm of uncertain behavior, unspecified: Secondary | ICD-10-CM | POA: Diagnosis not present

## 2024-03-17 DIAGNOSIS — C4492 Squamous cell carcinoma of skin, unspecified: Secondary | ICD-10-CM

## 2024-03-17 DIAGNOSIS — D259 Leiomyoma of uterus, unspecified: Secondary | ICD-10-CM | POA: Insufficient documentation

## 2024-03-17 HISTORY — DX: Squamous cell carcinoma of skin, unspecified: C44.92

## 2024-03-17 NOTE — Progress Notes (Signed)
   Follow-Up Visit   Subjective  Sonya Schmidt is a 73 y.o. female who presents for the following: Place on R side of face present x1 month, has crusted over. Has been applying witch hazel and eczema cream, but comes back.   The following portions of the chart were reviewed this encounter and updated as appropriate: medications, allergies, medical history  Review of Systems:  No other skin or systemic complaints except as noted in HPI or Assessment and Plan.  Objective  Well appearing patient in no apparent distress; mood and affect are within normal limits.  A focused examination was performed of the following areas: Face  Relevant exam findings are noted in the Assessment and Plan.  R perioral cheek 5 mm pink keratotic papule   Right forearm Two Pink scaly macules  Assessment & Plan   NEOPLASM OF SKIN R perioral cheek Skin / nail biopsy Type of biopsy: tangential   Informed consent: discussed and consent obtained   Timeout: patient name, date of birth, surgical site, and procedure verified   Procedure prep:  Patient was prepped and draped in usual sterile fashion Prep type:  Isopropyl alcohol Anesthesia: the lesion was anesthetized in a standard fashion   Anesthetic:  1% lidocaine  w/ epinephrine 1-100,000 buffered w/ 8.4% NaHCO3 Instrument used: DermaBlade   Hemostasis achieved with: pressure and aluminum chloride   Outcome: patient tolerated procedure well   Post-procedure details: sterile dressing applied and wound care instructions given   Dressing type: bandage and petrolatum    Specimen 1 - Surgical pathology Differential Diagnosis: SCC  Check Margins: No 5 mm pink keratotic papule AK (ACTINIC KERATOSIS) Right forearm Actinic keratoses are precancerous spots that appear secondary to cumulative UV radiation exposure/sun exposure over time. They are chronic with expected duration over 1 year. A portion of actinic keratoses will progress to squamous cell  carcinoma of the skin. It is not possible to reliably predict which spots will progress to skin cancer and so treatment is recommended to prevent development of skin cancer.  Recommend daily broad spectrum sunscreen SPF 30+ to sun-exposed areas, reapply every 2 hours as needed.  Recommend staying in the shade or wearing long sleeves, sun glasses (UVA+UVB protection) and wide brim hats (4-inch brim around the entire circumference of the hat). Call for new or changing lesions.  Discussed treatment with cryotherapy, would recommend returning sooner of any of these places grow and consider biopsy.  SEBORRHEIC KERATOSES    SEBORRHEIC KERATOSIS - Stuck-on, waxy, tan-brown papules and/or plaques R arm - Benign-appearing - Discussed benign etiology and prognosis. - Observe - Call for any changes   Return for As scheduled, w/ Dr. Jackquline, TBSE, pending biopsy results.  I, Jacquelynn V. Wilfred, CMA, am acting as scribe for Boneta Sharps, MD .   Documentation: I have reviewed the above documentation for accuracy and completeness, and I agree with the above.  Boneta Sharps, MD

## 2024-03-17 NOTE — Patient Instructions (Addendum)

## 2024-03-22 LAB — SURGICAL PATHOLOGY

## 2024-03-23 ENCOUNTER — Encounter: Payer: Self-pay | Admitting: Dermatology

## 2024-03-23 ENCOUNTER — Ambulatory Visit: Payer: Self-pay | Admitting: Dermatology

## 2024-03-23 DIAGNOSIS — C4492 Squamous cell carcinoma of skin, unspecified: Secondary | ICD-10-CM

## 2024-03-23 DIAGNOSIS — H6123 Impacted cerumen, bilateral: Secondary | ICD-10-CM | POA: Diagnosis not present

## 2024-03-23 DIAGNOSIS — H903 Sensorineural hearing loss, bilateral: Secondary | ICD-10-CM | POA: Diagnosis not present

## 2024-03-23 NOTE — Telephone Encounter (Signed)
 Advised patient pathology results, referral sent to Dr. Corey per patient's request. Lonell RAMAN., RMA

## 2024-03-23 NOTE — Telephone Encounter (Signed)
-----   Message from [place] sent at 03/23/2024  9:52 AM EDT ----- Diagnosis: R perioral cheek :       ATYPICAL SQUAMOUS PROLIFERATION, EVOLVING SQUAMOUS CELL CARCINOMA NOT EXCLUDED,       BASE INVOLVED, SEE DESCRIPTION    Please call with diagnosis and refer to Mohs surgery.  Explanation: This is a squamous cell skin cancer that has grown beyond the surface of the skin and is invading the second layer of the skin. It has the potential to spread beyond the skin and  threaten your health, so I recommend treating it.  Treatment: Given the location and type of skin cancer, I recommend Mohs surgery. Mohs surgery involves cutting out the skin cancer and then checking under the microscope to ensure the whole skin  cancer was removed. If any skin cancer remains, the surgeon will cut out more until it is fully removed. The cure rate is about 98-99%. Once the Mohs surgeon confirms the skin cancer is out, they  will discuss the options to repair or heal the area. You must take it easy for about two weeks after surgery (no lifting over 10-15 lbs, avoid activity to get your heart rate and blood pressure up).  It is done at another office outside of [place] ([place], [place], or [place]). ----- Message ----- From: Interface, [facility] Sent: 03/22/2024   6:03 PM EDT To: Boneta Sharps, MD

## 2024-04-18 ENCOUNTER — Encounter: Payer: Self-pay | Admitting: Dermatology

## 2024-04-21 ENCOUNTER — Encounter: Payer: Self-pay | Admitting: Dermatology

## 2024-04-21 ENCOUNTER — Ambulatory Visit: Admitting: Dermatology

## 2024-04-21 ENCOUNTER — Encounter: Admitting: Dermatology

## 2024-04-21 VITALS — BP 139/67 | HR 69 | Temp 97.8°F

## 2024-04-21 DIAGNOSIS — L814 Other melanin hyperpigmentation: Secondary | ICD-10-CM | POA: Diagnosis not present

## 2024-04-21 DIAGNOSIS — C4492 Squamous cell carcinoma of skin, unspecified: Secondary | ICD-10-CM

## 2024-04-21 DIAGNOSIS — L579 Skin changes due to chronic exposure to nonionizing radiation, unspecified: Secondary | ICD-10-CM | POA: Diagnosis not present

## 2024-04-21 DIAGNOSIS — D485 Neoplasm of uncertain behavior of skin: Secondary | ICD-10-CM | POA: Diagnosis not present

## 2024-04-21 NOTE — Patient Instructions (Signed)

## 2024-04-21 NOTE — Progress Notes (Unsigned)
 Follow-Up Visit   Subjective  Sonya Schmidt is a 73 y.o. female who presents for the following: Mohs on the right perioral cheek to remove an atypical squamous of right perioral cheek. The patient was seen on 03/17/2024 by Dr. Boneta Sharps at which time the lesion was biopsied.  The following portions of the chart were reviewed this encounter and updated as appropriate: medications, allergies, medical history  Review of Systems:  No other skin or systemic complaints except as noted in HPI or Assessment and Plan.  Objective  Well appearing patient in no apparent distress; mood and affect are within normal limits.  A focused examination was performed of the following areas: Right perioral cheek Relevant physical exam findings are noted in the Assessment and Plan.   Right perioral cheek Pink biopsy scar   Assessment & Plan   SQUAMOUS CELL CARCINOMA OF SKIN Right perioral cheek Mohs surgery  Consent obtained: written  Anticoagulation: Is the patient taking prescription anticoagulant and/or aspirin prescribed/recommended by a physician? No   Was the anticoagulation regimen changed prior to Mohs? No    Procedure Details: Timeout: pre-procedure verification complete Procedure Prep: patient was prepped and draped in usual sterile fashion Prep type: chlorhexidine Biopsy accession number: [id number] Biopsy lab: [facility] Date of biopsy: 03/17/2024 Frozen section biopsy performed: No   Specimen debulked: No   Pre-Op diagnosis: squamous cell carcinoma Surgical site (from skin exam): Right perioral cheek Pre-operative length (cm): 0.4 Pre-operative width (cm): 0.4 Indications for Mohs surgery: anatomic location where tissue conservation is critical  Micrographic Surgery Details: Post-operative length (cm): 1.2 Post-operative width (cm): 0.8 Post surgery depth of defect: subcutaneous fat  Patient tolerance of procedure: tolerated well, no immediate  complications  Reconstruction: Was the defect reconstructed? Yes   Was reconstruction performed by the same Mohs surgeon? Yes   Setting of reconstruction: outpatient office When was reconstruction performed? same day Type of reconstruction: linear Linear reconstruction: complex Length of linear repair (cm): 3  Opioids: Did the patient receive a prescription for opioid/narcotic related to Mohs surgery?: No    Antibiotics: Does patient meet AHA guidelines for endocarditis?: No   Does patient meet AHA guidelines for orthopedic prophylaxis?: No   Were antibiotics given on the day of surgery?: No   Did surgery breach mucosa, expose cartilage/bone, involve an area of lymphedema/inflamed/infected tissue? No    Skin repair Complexity:  Complex Final length (cm):  2.6 Informed consent: discussed and consent obtained   Timeout: patient name, date of birth, surgical site, and procedure verified   Procedure prep:  Patient was prepped and draped in usual sterile fashion Prep type:  Chlorhexidine Anesthesia: the lesion was anesthetized in a standard fashion   Anesthetic:  1% lidocaine  w/ epinephrine 1-100,000 buffered w/ 8.4% NaHCO3 Reason for type of repair: reduce tension to allow closure and preserve normal anatomical and functional relationships   Undermining: edges undermined   Subcutaneous layers (deep stitches):  Suture size:  5-0 Suture type: Monocryl (poliglecaprone 25)   Stitches:  Buried horizontal mattress Fine/surface layer approximation (top stitches):  Suture size:  6-0 Suture type: fast-absorbing plain gut   Stitches: simple running   Hemostasis achieved with: electrodesiccation Outcome: patient tolerated procedure well with no complications   Post-procedure details: sterile dressing applied and wound care instructions given   Dressing type: pressure dressing and petrolatum      Return in about 1 month (around 05/22/2024) for Wound Check.  [name], RN, am  acting as scribe  for RUFUS CHRISTELLA HOLY, MD .   Documentation: I have reviewed the above documentation for accuracy and completeness, and I agree with the above.  RUFUS CHRISTELLA HOLY, MD

## 2024-05-03 ENCOUNTER — Encounter: Payer: Self-pay | Admitting: Dermatology

## 2024-05-23 ENCOUNTER — Encounter: Payer: Self-pay | Admitting: Dermatology

## 2024-05-23 ENCOUNTER — Ambulatory Visit (INDEPENDENT_AMBULATORY_CARE_PROVIDER_SITE_OTHER): Admitting: Dermatology

## 2024-05-23 VITALS — BP 116/70 | HR 70

## 2024-05-23 DIAGNOSIS — Z85828 Personal history of other malignant neoplasm of skin: Secondary | ICD-10-CM

## 2024-05-23 DIAGNOSIS — C4492 Squamous cell carcinoma of skin, unspecified: Secondary | ICD-10-CM

## 2024-05-23 DIAGNOSIS — L905 Scar conditions and fibrosis of skin: Secondary | ICD-10-CM | POA: Diagnosis not present

## 2024-05-23 NOTE — Progress Notes (Signed)
   Follow Up Visit   Subjective  Sonya Schmidt is a 72 y.o. female who presents for the following: follow up from Mohs surgery   The patient presents for follow up from Mohs surgery for a SCC on the right perioral cheek, treated on 04/21/2024, repaired with a complex linear closure. The patient has been bandaging the wound as directed. The endorse the following concerns: none. Patient states that she started scar massage yesterday.  The following portions of the chart were reviewed this encounter and updated as appropriate: medications, allergies, medical history  Review of Systems:  No other skin or systemic complaints except as noted in HPI or Assessment and Plan.  Objective  Well appearing patient in no apparent distress; mood and affect are within normal limits.  A focal examination was performed including the right cheek. All findings within normal limits unless otherwise noted below.  Healing wound with mild erythema  Relevant physical exam findings are noted in the Assessment and Plan.    Assessment & Plan   Scar s/p Mohs for a SCC on the right perioral cheek, treated on 04/21/2024, repaired with a complex linear closure - Reassured that wound is healing well - No evidence of infection - No swelling, induration, purulence, dehiscence, or tenderness out of proportion to the clinical exam, see photo above - Discussed that scars take up to 12 months to mature from the date of surgery - Recommend SPF 30+ to scar daily to prevent purple color from UV exposure during scar maturation process - Discussed that erythema and raised appearance of scar will fade over the next 4-6 months - OK to start scar massage at 4-6 weeks post-op - Can consider silicone based products for scar healing starting at 6 weeks post-op - Ok to discontinue ointment daily  HISTORY OF SQUAMOUS CELL CARCINOMA OF THE SKIN - No evidence of recurrence today - No lymphadenopathy - Recommend regular full body  skin exams - Recommend daily broad spectrum sunscreen SPF 30+ to sun-exposed areas, reapply every 2 hours as needed.  - Call if any new or changing lesions are noted between office visits  Return in about 6 months (around 11/20/2024) for FBSE with Erminio or Dr. Alm.  Sonya Rollene Gobble, RN, am acting as scribe for Sonya CHRISTELLA HOLY, MD .   Documentation: I have reviewed the above documentation for accuracy and completeness, and I agree with the above.  Sonya CHRISTELLA HOLY, MD

## 2024-05-23 NOTE — Patient Instructions (Signed)

## 2024-11-21 ENCOUNTER — Ambulatory Visit: Admitting: Dermatology

## 2024-11-29 ENCOUNTER — Encounter: Admitting: Dermatology
